# Patient Record
Sex: Female | Born: 1990 | Race: Black or African American | Hispanic: No | Marital: Single | Smoking: Never smoker
Health system: Southern US, Community
[De-identification: ages and names within clinical notes are randomized; demographics above are authoritative.]

## PROBLEM LIST (undated history)

## (undated) ENCOUNTER — Inpatient Hospital Stay (HOSPITAL_COMMUNITY): Payer: Self-pay

## (undated) DIAGNOSIS — I4891 Unspecified atrial fibrillation: Secondary | ICD-10-CM

## (undated) DIAGNOSIS — F419 Anxiety disorder, unspecified: Secondary | ICD-10-CM

## (undated) HISTORY — DX: Unspecified atrial fibrillation: I48.91

## (undated) HISTORY — DX: Anxiety disorder, unspecified: F41.9

## (undated) HISTORY — PX: OTHER SURGICAL HISTORY: SHX169

---

## 2012-10-18 ENCOUNTER — Emergency Department (HOSPITAL_COMMUNITY)
Admission: EM | Admit: 2012-10-18 | Discharge: 2012-10-19 | Disposition: A | Discharge status: Home / Self Care | Payer: BC Managed Care – PPO | Attending: Emergency Medicine | Admitting: Emergency Medicine

## 2012-10-18 ENCOUNTER — Encounter (HOSPITAL_COMMUNITY): Payer: Self-pay | Admitting: Adult Health

## 2012-10-18 DIAGNOSIS — R002 Palpitations: Secondary | ICD-10-CM | POA: Insufficient documentation

## 2012-10-18 DIAGNOSIS — R5383 Other fatigue: Secondary | ICD-10-CM | POA: Insufficient documentation

## 2012-10-18 DIAGNOSIS — R0602 Shortness of breath: Secondary | ICD-10-CM | POA: Insufficient documentation

## 2012-10-18 DIAGNOSIS — I4891 Unspecified atrial fibrillation: Secondary | ICD-10-CM | POA: Insufficient documentation

## 2012-10-18 DIAGNOSIS — F17200 Nicotine dependence, unspecified, uncomplicated: Secondary | ICD-10-CM | POA: Insufficient documentation

## 2012-10-18 DIAGNOSIS — R5381 Other malaise: Secondary | ICD-10-CM | POA: Insufficient documentation

## 2012-10-18 DIAGNOSIS — Z3202 Encounter for pregnancy test, result negative: Secondary | ICD-10-CM | POA: Insufficient documentation

## 2012-10-18 LAB — CBC WITH DIFFERENTIAL/PLATELET
Basophils Absolute: 0 10*3/uL (ref 0.0–0.1)
Basophils Relative: 0 % (ref 0–1)
Eosinophils Absolute: 0 10*3/uL (ref 0.0–0.7)
Hemoglobin: 14.7 g/dL (ref 12.0–15.0)
MCH: 26.8 pg (ref 26.0–34.0)
MCHC: 33.6 g/dL (ref 30.0–36.0)
Monocytes Relative: 3 % (ref 3–12)
Neutro Abs: 6.3 10*3/uL (ref 1.7–7.7)
Neutrophils Relative %: 79 % — ABNORMAL HIGH (ref 43–77)
Platelets: 272 10*3/uL (ref 150–400)
RBC: 5.48 MIL/uL — ABNORMAL HIGH (ref 3.87–5.11)

## 2012-10-18 LAB — POCT PREGNANCY, URINE: Preg Test, Ur: NEGATIVE

## 2012-10-18 LAB — URINALYSIS, ROUTINE W REFLEX MICROSCOPIC
Bilirubin Urine: NEGATIVE
Ketones, ur: 15 mg/dL — AB
Leukocytes, UA: NEGATIVE
Nitrite: NEGATIVE
Protein, ur: NEGATIVE mg/dL
Urobilinogen, UA: 1 mg/dL (ref 0.0–1.0)
pH: 7.5 (ref 5.0–8.0)

## 2012-10-18 LAB — BASIC METABOLIC PANEL
BUN: 14 mg/dL (ref 6–23)
Chloride: 102 mEq/L (ref 96–112)
GFR calc Af Amer: >90 mL/min (ref >90)
GFR calc non Af Amer: >90 mL/min (ref >90)
Potassium: 3.7 mEq/L (ref 3.5–5.1)
Sodium: 137 mEq/L (ref 135–145)

## 2012-10-18 LAB — TROPONIN I: Troponin I: <0.3 ng/mL (ref <0.30)

## 2012-10-18 MED ORDER — METOPROLOL SUCCINATE ER 25 MG PO TB24: 25.0000 mg | ORAL_TABLET | Freq: Two times a day (BID) | ORAL | Status: DC

## 2012-10-18 NOTE — ED Notes (Addendum)
Presents with sudden onset of chest pain associated with nausea that began at 20:30. Earlier in the day reports feeling lightheaded and seeing black spots stating and headache, "It felt like a migraine was about to come on" . Endorses Diarrhea for 3 weeks and chills.  Denies chest pain at this time. HR irregular. Pt is visibly uncomfortable. Bilateral breath sounds clear.

## 2012-10-18 NOTE — ED Provider Notes (Signed)
CSN: [id number]     Arrival date & time 10/18/12  2128 History     First MD Initiated Contact with Patient 10/18/12 2155     Chief Complaint  Patient presents with  . Chest Pain   (Consider location/radiation/quality/duration/timing/severity/associated sxs/prior Treatment) HPI Comments: Patient presents with complaints of diarrhea intermittently for the past 3 weeks.  She started today feeling dizzy, nauseated, tight in the chest, and felt her heart beating erratically.  She has never had this happen before.  No fevers or chills.  She denies excessive caffeine intake.  She does admit to smoking marijuana, but denies cocaine or other drug use.  Her LMP was several months ago as she is on the depo shot.    Patient is a 22 y.o. female presenting with chest pain. The history is provided by the patient.  Chest Pain Pain location:  Substernal area Pain quality: tightness   Pain radiates to:  Does not radiate Pain radiates to the back: no   Pain severity:  Moderate Onset quality:  Gradual Duration:  2 days Timing:  Constant Progression:  Worsening Chronicity:  New Context: not breathing   Relieved by:  Nothing Worsened by:  Nothing tried Associated symptoms: fatigue, palpitations and shortness of breath   Associated symptoms: no fever     History reviewed. No pertinent past medical history. History reviewed. No pertinent past surgical history. History reviewed. No pertinent family history. History  Substance Use Topics  . Smoking status: Current Some Day Smoker    Types: Cigarettes  . Smokeless tobacco: Not on file  . Alcohol Use: No   OB History   Grav Para Term Preterm Abortions TAB SAB Ect Mult Living                 Review of Systems  Constitutional: Positive for fatigue. Negative for fever and chills.  Respiratory: Positive for shortness of breath.   Cardiovascular: Positive for chest pain and palpitations.  All other systems reviewed and are negative.    Allergies   Review of patient's allergies indicates no known allergies.  Home Medications  No current outpatient prescriptions on file. BP 144/77  Pulse 101  Temp(Src) 98.7 F (37.1 C)  Resp 16  SpO2 100% Physical Exam  Nursing note and vitals reviewed. Constitutional: She is oriented to person, place, and time. She appears well-developed and well-nourished. No distress.  HENT:  Head: Normocephalic and atraumatic.  Neck: Normal range of motion. Neck supple.  Cardiovascular: Exam reveals no gallop and no friction rub.   No murmur heard. The heart is irregularly irregular.    Pulmonary/Chest: Effort normal and breath sounds normal. No respiratory distress. She has no wheezes.  Abdominal: Soft. Bowel sounds are normal. She exhibits no distension. There is no tenderness.  Musculoskeletal: Normal range of motion. She exhibits no edema.  Neurological: She is alert and oriented to person, place, and time.  Skin: Skin is warm and dry. She is not diaphoretic.    ED Course   Procedures (including critical care time)  Labs Reviewed  CBC WITH DIFFERENTIAL  BASIC METABOLIC PANEL  TSH  PREGNANCY, URINE  URINALYSIS, ROUTINE W REFLEX MICROSCOPIC  URINE RAPID DRUG SCREEN (HOSP PERFORMED)  TROPONIN I  POCT PREGNANCY, URINE   No results found. No diagnosis found.   Date: 10/18/2012  Rate: 99  Rhythm: atrial fibrillation  QRS Axis: normal  Intervals: normal  ST/T Wave abnormalities: normal  Conduction Disutrbances:none  Narrative Interpretation:   Old EKG Reviewed: none  available   MDM  The patient presents here with complaints of palpitations, shortness of breath.  The workup reveals atrial fibrillation with controlled ventricular response.  Her oxygen saturations are 100% and there is no evidence of chf.  She is not tachypneic and otherwise appears well.  The workup reveals negative troponin, electrolytes, and blood counts.  I have spoken with the cardiology fellow who recommends a beta  blocker and followup with cardiology.  She was re-examined and appears well.  Will prescribe metoprolol and give the followup information for [facility].    Geoffery Lyons, MD 10/18/12 [phone]

## 2012-10-19 LAB — PREGNANCY, URINE: Preg Test, Ur: NEGATIVE

## 2012-10-19 LAB — RAPID URINE DRUG SCREEN, HOSP PERFORMED
Barbiturates: NOT DETECTED
Benzodiazepines: NOT DETECTED

## 2012-10-19 LAB — TSH: TSH: 0.596 u[IU]/mL (ref 0.350–4.500)

## 2012-10-20 ENCOUNTER — Emergency Department (HOSPITAL_COMMUNITY): Payer: BC Managed Care – PPO

## 2012-10-20 ENCOUNTER — Encounter (HOSPITAL_COMMUNITY): Payer: Self-pay | Admitting: Emergency Medicine

## 2012-10-20 ENCOUNTER — Emergency Department (HOSPITAL_COMMUNITY)
Admission: EM | Admit: 2012-10-20 | Discharge: 2012-10-20 | Disposition: A | Discharge status: Home / Self Care | Payer: BC Managed Care – PPO | Attending: Emergency Medicine | Admitting: Emergency Medicine

## 2012-10-20 DIAGNOSIS — F17200 Nicotine dependence, unspecified, uncomplicated: Secondary | ICD-10-CM | POA: Insufficient documentation

## 2012-10-20 DIAGNOSIS — I4891 Unspecified atrial fibrillation: Secondary | ICD-10-CM

## 2012-10-20 DIAGNOSIS — F411 Generalized anxiety disorder: Secondary | ICD-10-CM | POA: Insufficient documentation

## 2012-10-20 DIAGNOSIS — R064 Hyperventilation: Secondary | ICD-10-CM | POA: Insufficient documentation

## 2012-10-20 DIAGNOSIS — R079 Chest pain, unspecified: Secondary | ICD-10-CM | POA: Insufficient documentation

## 2012-10-20 DIAGNOSIS — F419 Anxiety disorder, unspecified: Secondary | ICD-10-CM

## 2012-10-20 MED ORDER — METOPROLOL SUCCINATE 12.5 MG HALF TABLET: 12.5000 mg | ORAL_TABLET | Freq: Two times a day (BID) | ORAL | Status: DC

## 2012-10-20 NOTE — ED Notes (Addendum)
PT was seen here yesterday and was put on metoprolol. Mother has hx of afib and apparently was dx with afib yesterday as well. Pt also has hx of anxiety. Was at work and started feeling anxious and was unable to calm down. When EMS got there, they were able to calm her down. 12 lead unremarkable. Heart rate was in 70's.

## 2012-10-20 NOTE — ED Provider Notes (Signed)
CSN: [id number]     Arrival date & time 10/20/12  1237 History     First MD Initiated Contact with Patient 10/20/12 1244     Chief Complaint  Patient presents with  . Panic Attack   (Consider location/radiation/quality/duration/timing/severity/associated sxs/prior Treatment) The history is provided by the patient.  pt c/o feeling very anxious while at work today. States she felt a transient sharp pain in mid chest, lasted a couple seconds. 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 Was hyperventilating. ems called, pt able to be calmed down. Pt denies palpitations or sense of rapid or irregular heart beat. No sob. No nv. Pt now states is feeling improved. No cp.  Pt was seen in ed a couple days ago. Was found to be in afib, was given rx metoprolol w cardiology follow up this week.  Pt indicates otherwise feels well, at baseline. No syncopal episodes. Today no sense of rapid or irregular heartbeat.     No past medical history on file. No past surgical history on file. No family history on file. History  Substance Use Topics  . Smoking status: Current Some Day Smoker    Types: Cigarettes  . Smokeless tobacco: Not on file  . Alcohol Use: No   OB History   Grav Para Term Preterm Abortions TAB SAB Ect Mult Living                 Review of Systems  Constitutional: Negative for fever and chills.  HENT: Negative for neck pain.   Eyes: Negative for visual disturbance.  Respiratory: Negative for shortness of breath.   Cardiovascular: Negative for chest pain, palpitations and leg swelling.  Gastrointestinal: Negative for abdominal pain.  Genitourinary: Negative for flank pain.  Musculoskeletal: Negative for back pain.  Skin: Negative for rash.  Neurological: Negative for headaches.  Hematological: Does not bruise/bleed easily.  Psychiatric/Behavioral: The patient is nervous/anxious.     Allergies  Review of patient's allergies indicates no known  allergies.  Home Medications   Current Outpatient Rx  Name  Route  Sig  Dispense  Refill  . ibuprofen (ADVIL,MOTRIN) 600 MG tablet   Oral   Take 600 mg by mouth every 6 (six) hours as needed for pain.         . MedroxyPROGESTERone Acetate (DEPO-PROVERA IM)   Intramuscular   Inject 1 each into the muscle every 3 (three) months.         . metoprolol succinate (TOPROL XL) 25 MG 24 hr tablet   Oral   Take 1 tablet (25 mg total) by mouth 2 (two) times daily.   20 tablet   0    BP 126/76  Pulse 72  Temp(Src) 99 F (37.2 C) (Oral)  Resp 18  Ht 5\' 7"  (1.702 m)  Wt 155 lb (70.308 kg)  BMI 24.27 kg/m2  SpO2 100% Physical Exam  Nursing note and vitals reviewed. Constitutional: She is oriented to person, place, and time. She appears well-developed and well-nourished. No distress.  HENT:  Mouth/Throat: Oropharynx is clear and moist.  Eyes: Conjunctivae are normal. No scleral icterus.  Neck: Neck supple. No tracheal deviation present. No thyromegaly present.  Cardiovascular: Normal rate, regular rhythm, normal heart sounds and intact distal pulses.  Exam reveals no gallop and no friction rub.   No murmur heard. Pulmonary/Chest: Effort normal and breath sounds normal. No respiratory distress.  Abdominal: Normal appearance. She exhibits no distension. There is no tenderness.  Musculoskeletal:  She exhibits no edema and no tenderness.  Neurological: She is alert and oriented to person, place, and time.  Skin: Skin is warm and dry. No rash noted. She is not diaphoretic.  Psychiatric: She has a normal mood and affect.    ED Course   Procedures (including critical care time)  Results for orders placed during the hospital encounter of 10/18/12  CBC WITH DIFFERENTIAL      Result Value Range   WBC 7.9  4.0 - 10.5 K/uL   RBC 5.48 (*) 3.87 - 5.11 MIL/uL   Hemoglobin 14.7  12.0 - 15.0 g/dL   HCT 46.9  62.9 - 52.8 %   MCV 79.9  78.0 - 100.0 fL   MCH 26.8  26.0 - 34.0 pg   MCHC  33.6  30.0 - 36.0 g/dL   RDW 41.3  24.4 - 01.0 %   Platelets 272  150 - 400 K/uL   Neutrophils Relative % 79 (*) 43 - 77 %   Neutro Abs 6.3  1.7 - 7.7 K/uL   Lymphocytes Relative 18  12 - 46 %   Lymphs Abs 1.4  0.7 - 4.0 K/uL   Monocytes Relative 3  3 - 12 %   Monocytes Absolute 0.2  0.1 - 1.0 K/uL   Eosinophils Relative 0  0 - 5 %   Eosinophils Absolute 0.0  0.0 - 0.7 K/uL   Basophils Relative 0  0 - 1 %   Basophils Absolute 0.0  0.0 - 0.1 K/uL  BASIC METABOLIC PANEL      Result Value Range   Sodium 137  135 - 145 mEq/L   Potassium 3.7  3.5 - 5.1 mEq/L   Chloride 102  96 - 112 mEq/L   CO2 21  19 - 32 mEq/L   Glucose, Bld 102 (*) 70 - 99 mg/dL   BUN 14  6 - 23 mg/dL   Creatinine, Ser 2.72  0.50 - 1.10 mg/dL   Calcium 9.8  8.4 - 53.6 mg/dL   GFR calc non Af Amer >90  >90 mL/min   GFR calc Af Amer >90  >90 mL/min  TSH      Result Value Range   TSH 0.596  0.350 - 4.500 uIU/mL  PREGNANCY, URINE      Result Value Range   Preg Test, Ur NEGATIVE  NEGATIVE  URINALYSIS, ROUTINE W REFLEX MICROSCOPIC      Result Value Range   Color, Urine YELLOW  YELLOW   APPearance CLEAR  CLEAR   Specific Gravity, Urine 1.018  1.005 - 1.030   pH 7.5  5.0 - 8.0   Glucose, UA NEGATIVE  NEGATIVE mg/dL   Hgb urine dipstick NEGATIVE  NEGATIVE   Bilirubin Urine NEGATIVE  NEGATIVE   Ketones, ur 15 (*) NEGATIVE mg/dL   Protein, ur NEGATIVE  NEGATIVE mg/dL   Urobilinogen, UA 1.0  0.0 - 1.0 mg/dL   Nitrite NEGATIVE  NEGATIVE   Leukocytes, UA NEGATIVE  NEGATIVE  URINE RAPID DRUG SCREEN (HOSP PERFORMED)      Result Value Range   Opiates NONE DETECTED  NONE DETECTED   Cocaine NONE DETECTED  NONE DETECTED   Benzodiazepines NONE DETECTED  NONE DETECTED   Amphetamines NONE DETECTED  NONE DETECTED   Tetrahydrocannabinol NONE DETECTED  NONE DETECTED   Barbiturates NONE DETECTED  NONE DETECTED  TROPONIN I      Result Value Range   Troponin I <0.30  <0.30 ng/mL  POCT PREGNANCY, URINE  Result Value  Range   Preg Test, Ur NEGATIVE  NEGATIVE   Dg Chest 2 View  10/20/2012   *RADIOLOGY REPORT*  Clinical Data: Chest pressure, AFib  CHEST - 2 VIEW  Comparison: None.  Findings: Lungs are clear. No pleural effusion or pneumothorax.  Cardiomediastinal silhouette is within normal limits.  Visualized osseous structures are within normal limits.  IMPRESSION: No evidence of acute cardiopulmonary disease.   Original Report Authenticated By: Charline Bills, M.D.      MDM  Monitor.   Date: 10/20/2012  Rate: 61  Rhythm: normal sinus rhythm  QRS Axis: normal  Intervals: normal  ST/T Wave abnormalities: normal  Conduction Disutrbances:none  Narrative Interpretation:   Old EKG Reviewed: changes noted  Reviewed nursing notes and prior charts for additional history.   Reviewed recent lab results from 8/1.   Pt states overall feels better since rx metoprolol, but states occasionally notes mild nausea, lightheaded feeling when stands quickly.  Pt remains in nsr.  Will decreased metoprolol dose to 12/5 mg bid to minimize potentially side effects.  Recheck pt, remains asymptomatic. Nsr. Pt stable for d/c.        Suzi Roots, MD 10/20/12 [phone]

## 2012-10-20 NOTE — ED Notes (Signed)
MD at bedside. 

## 2012-10-20 NOTE — ED Notes (Signed)
Patient transported to X-ray 

## 2012-10-21 ENCOUNTER — Encounter: Payer: Self-pay | Admitting: *Deleted

## 2012-10-21 ENCOUNTER — Encounter: Payer: Self-pay | Admitting: Cardiology

## 2012-10-21 DIAGNOSIS — I4891 Unspecified atrial fibrillation: Secondary | ICD-10-CM | POA: Insufficient documentation

## 2012-10-21 DIAGNOSIS — F419 Anxiety disorder, unspecified: Secondary | ICD-10-CM | POA: Insufficient documentation

## 2012-10-23 ENCOUNTER — Ambulatory Visit (INDEPENDENT_AMBULATORY_CARE_PROVIDER_SITE_OTHER): Payer: BC Managed Care – PPO | Admitting: Cardiology

## 2012-10-23 ENCOUNTER — Encounter: Payer: Self-pay | Admitting: Cardiology

## 2012-10-23 VITALS — BP 118/70 | HR 70 | Ht 67.0 in | Wt 150.0 lb

## 2012-10-23 DIAGNOSIS — R079 Chest pain, unspecified: Secondary | ICD-10-CM

## 2012-10-23 DIAGNOSIS — I4891 Unspecified atrial fibrillation: Secondary | ICD-10-CM

## 2012-10-23 DIAGNOSIS — R002 Palpitations: Secondary | ICD-10-CM | POA: Insufficient documentation

## 2012-10-23 MED ORDER — METOPROLOL SUCCINATE 12.5 MG HALF TABLET: 12.5000 mg | ORAL_TABLET | Freq: Two times a day (BID) | ORAL | Status: DC

## 2012-10-23 NOTE — Progress Notes (Signed)
  HPI: 22 year old female for evaluation of atrial fibrillation. Chest x-ray on 10/20/2012 showed no acute cardiopulmonary disease. Laboratories in August of 2014 showed a TSH of 0.596, negative drug screen, negative pregnancy test, hemoglobin 14.7, BUN and creatinine 14/0.84. Patient seen in the emergency room on August 1 with complaints of diarrhea. She also described dizziness and chest tightness as well as palpitations. Patient noted to be in atrial fibrillation. Patient given a prescription for metoprolol and scheduled to followup here. Seen again on August 3 with a transient sharp pain in the mid chest. Followup electrocardiogram showed sinus rhythm. Patient states that on the day of evaluation she developed symptoms similar to a migraine. She had nausea and vomiting again noted chest tightness and palpitations. She has continued to have chest tightness almost continuously since then. She has some dyspnea on exertion but there is no orthopnea, PND, pedal edema or syncope.  Current Outpatient Prescriptions  Medication Sig Dispense Refill  . ibuprofen (ADVIL,MOTRIN) 600 MG tablet Take 600 mg by mouth every 6 (six) hours as needed for pain.      . MedroxyPROGESTERone Acetate (DEPO-PROVERA IM) Inject 1 each into the muscle every 3 (three) months.      . metoprolol succinate (TOPROL XL) 12.5 mg TB24 Take 0.5 tablets (12.5 mg total) by mouth 2 (two) times daily.  20 tablet  0   No current facility-administered medications for this visit.    No Known Allergies  Past Medical History  Diagnosis Date  . Anxiety   . A-fib     History reviewed. No pertinent past surgical history.  History   Social History  . Marital Status: Single    Spouse Name: N/A    Number of Children: N/A  . Years of Education: N/A   Occupational History  . Not on file.   Social History Main Topics  . Smoking status: Never Smoker   . Smokeless tobacco: Not on file  . Alcohol Use: Yes     Comment: Occasional  .  Drug Use: Yes    Special: Marijuana  . Sexually Active: Not on file   Other Topics Concern  . Not on file   Social History Narrative  . No narrative on file    Family History  Problem Relation Age of Onset  . Heart disease Mother     Atrial fibrillation    ROS: no fevers or chills, productive cough, hemoptysis, dysphasia, odynophagia, melena, hematochezia, dysuria, hematuria, rash, seizure activity, orthopnea, PND, pedal edema, claudication. Remaining systems are negative.  Physical Exam:   Blood pressure 118/70, pulse 70, height 5\' 7"  (1.702 m), weight 150 lb (68.04 kg).  General:  Well developed/well nourished in NAD Skin warm/dry, tattoos Patient not depressed No peripheral clubbing Back-normal HEENT-normal/normal eyelids Neck supple/normal carotid upstroke bilaterally; no bruits; no JVD; no thyromegaly chest - CTA/ normal expansion CV - RRR/normal S1 and S2; no murmurs, rubs or gallops;  PMI nondisplaced Abdomen -NT/ND, no HSM, no mass, + bowel sounds, no bruit 2+ femoral pulses, no bruits Ext-no edema, chords, 2+ DP Neuro-grossly nonfocal  ECG 10/18/2012, atrial fibrillation, low voltage. Nonspecific ST changes. 10/20/2012-sinus rhythm, low voltage, poor R wave progression, nonspecific ST changes. Sinus rhythm at a rate of 73 with no ST changes.

## 2012-10-23 NOTE — Patient Instructions (Addendum)
Your physician recommends that you schedule a follow-up appointment in: 3 MONTHS WITH DR CRENSHAW   Your physician has requested that you have an echocardiogram. Echocardiography is a painless test that uses sound waves to create images of your heart. It provides your doctor with information about the size and shape of your heart and how well your heart's chambers and valves are working. This procedure takes approximately one hour. There are no restrictions for this procedure.   Your physician recommends that you HAVE LAB WORK TODAY 

## 2012-10-23 NOTE — Assessment & Plan Note (Signed)
Patient with paroxysmal atrial fibrillation. She is in sinus rhythm today. She has no embolic risk factors. We'll continue metoprolol for rate control if her atrial fibrillation recurs. Check echocardiogram for LV function. Recent TSH normal.

## 2012-10-23 NOTE — Assessment & Plan Note (Signed)
Patient also with h/o palpitations; if she has frequent episodes in the future, will consider monitor (not clear all symptoms are related to atrial fibrillation).

## 2012-10-23 NOTE — Assessment & Plan Note (Signed)
Etiology unclear.electrocardiogram shows no ST changes. No right upper quadrant tenderness. Will arrange echocardiogram to assess LV function. Note she has some dyspnea on exertion and question pleuritic component to her pain. Will schedule a d-dimer to screen for pulmonary embolus although I think this is unlikely.

## 2012-10-24 LAB — D-DIMER, QUANTITATIVE: D-Dimer, Quant: 0.74 ug/mL-FEU — ABNORMAL HIGH (ref 0.00–0.48)

## 2012-10-25 ENCOUNTER — Telehealth: Payer: Self-pay | Admitting: Cardiology

## 2012-10-25 ENCOUNTER — Other Ambulatory Visit: Payer: Self-pay | Admitting: *Deleted

## 2012-10-25 DIAGNOSIS — R7989 Other specified abnormal findings of blood chemistry: Secondary | ICD-10-CM

## 2012-10-25 NOTE — Telephone Encounter (Signed)
Spoke with pt, aware of elevated d-dimer. She will have a CTA to r/o PE Tuesday 10-29-12 @ 3:30 PM prior to having her echo. Left message for pt of time and location of CTA.

## 2012-10-25 NOTE — Telephone Encounter (Signed)
New prob  Pt returned call about results.

## 2012-10-29 ENCOUNTER — Ambulatory Visit (HOSPITAL_COMMUNITY): Payer: BC Managed Care – PPO | Attending: Cardiology | Admitting: Radiology

## 2012-10-29 ENCOUNTER — Ambulatory Visit (INDEPENDENT_AMBULATORY_CARE_PROVIDER_SITE_OTHER)
Admission: RE | Admit: 2012-10-29 | Discharge: 2012-10-29 | Disposition: A | Discharge status: Home / Self Care | Payer: BC Managed Care – PPO | Source: Ambulatory Visit | Attending: Cardiology | Admitting: Cardiology

## 2012-10-29 DIAGNOSIS — I4891 Unspecified atrial fibrillation: Secondary | ICD-10-CM

## 2012-10-29 DIAGNOSIS — R002 Palpitations: Secondary | ICD-10-CM | POA: Insufficient documentation

## 2012-10-29 DIAGNOSIS — R7989 Other specified abnormal findings of blood chemistry: Secondary | ICD-10-CM

## 2012-10-29 DIAGNOSIS — R791 Abnormal coagulation profile: Secondary | ICD-10-CM

## 2012-10-29 DIAGNOSIS — R079 Chest pain, unspecified: Secondary | ICD-10-CM | POA: Insufficient documentation

## 2012-10-29 DIAGNOSIS — R072 Precordial pain: Secondary | ICD-10-CM

## 2012-10-29 MED ORDER — IOHEXOL 350 MG/ML SOLN
80.0000 mL | Freq: Once | INTRAVENOUS | Status: AC | PRN
  Administered 2012-10-29: 80 mL via INTRAVENOUS

## 2012-10-29 NOTE — Progress Notes (Signed)
Echocardiogram performed.  

## 2012-10-30 ENCOUNTER — Telehealth: Payer: Self-pay | Admitting: Cardiology

## 2012-10-30 NOTE — Telephone Encounter (Signed)
Spoke with pt, aware of echo and CT results. 

## 2012-10-30 NOTE — Telephone Encounter (Signed)
New problem    Patient calling stating someone called her this am. Returning call back

## 2012-10-30 NOTE — Telephone Encounter (Signed)
Left message for pt to call.

## 2012-10-31 ENCOUNTER — Ambulatory Visit: Payer: BC Managed Care – PPO | Admitting: Cardiovascular Disease

## 2012-11-07 ENCOUNTER — Telehealth: Payer: Self-pay

## 2012-11-07 NOTE — Telephone Encounter (Signed)
I spoke with Clydie Braun and made her aware of Rx per Dr Jens Som.  ----- Message ----- From: Lewayne Bunting, MD Sent: 11/07/2012 2:39 PM To: Deliah Goody, RN Subject: FW: clarify Toprol Rx Toprol 12.5 mg po BID Olga Millers ----- Message ----- From: Sharyn Blitz, RN Sent: 11/07/2012 2:01 PM To: Lewayne Bunting, MD Subject: clarify Toprol Rx Pharmacy called because Rx was written for Toprol XL 12.5 mg tablet take 1/2 tablet by mouth twice a day--pharmacy need to verify if this is correct or should it have been 25mg  tablet take 1/2 tab by mouth twice a day Thanks

## 2013-01-29 ENCOUNTER — Ambulatory Visit: Payer: BC Managed Care – PPO | Admitting: Cardiology

## 2013-02-21 ENCOUNTER — Encounter: Payer: Self-pay | Admitting: Cardiology

## 2013-05-08 ENCOUNTER — Encounter: Payer: Self-pay | Admitting: Cardiology

## 2013-06-24 ENCOUNTER — Encounter (HOSPITAL_COMMUNITY): Payer: Self-pay | Admitting: Emergency Medicine

## 2013-06-24 ENCOUNTER — Emergency Department (HOSPITAL_COMMUNITY)
Admission: EM | Admit: 2013-06-24 | Discharge: 2013-06-24 | Disposition: A | Discharge status: Home / Self Care | Payer: BC Managed Care – PPO | Attending: Emergency Medicine | Admitting: Emergency Medicine

## 2013-06-24 DIAGNOSIS — R05 Cough: Secondary | ICD-10-CM | POA: Insufficient documentation

## 2013-06-24 DIAGNOSIS — R059 Cough, unspecified: Secondary | ICD-10-CM | POA: Insufficient documentation

## 2013-06-24 DIAGNOSIS — Z3202 Encounter for pregnancy test, result negative: Secondary | ICD-10-CM | POA: Insufficient documentation

## 2013-06-24 DIAGNOSIS — F411 Generalized anxiety disorder: Secondary | ICD-10-CM | POA: Insufficient documentation

## 2013-06-24 DIAGNOSIS — F410 Panic disorder [episodic paroxysmal anxiety] without agoraphobia: Secondary | ICD-10-CM

## 2013-06-24 DIAGNOSIS — R0602 Shortness of breath: Secondary | ICD-10-CM | POA: Insufficient documentation

## 2013-06-24 LAB — POC URINE PREG, ED
PREG TEST UR: NEGATIVE
Preg Test, Ur: NEGATIVE

## 2013-06-24 LAB — PRO B NATRIURETIC PEPTIDE: Pro B Natriuretic peptide (BNP): 10.3 pg/mL (ref 0–125)

## 2013-06-24 LAB — BASIC METABOLIC PANEL
BUN: 16 mg/dL (ref 6–23)
CALCIUM: 9.3 mg/dL (ref 8.4–10.5)
CO2: 24 meq/L (ref 19–32)
CREATININE: 0.98 mg/dL (ref 0.50–1.10)
Chloride: 102 mEq/L (ref 96–112)
GFR calc Af Amer: >90 mL/min (ref >90)
GFR calc non Af Amer: 82 mL/min — ABNORMAL LOW (ref >90)
Glucose, Bld: 119 mg/dL — ABNORMAL HIGH (ref 70–99)
Potassium: 3.4 mEq/L — ABNORMAL LOW (ref 3.7–5.3)
Sodium: 142 mEq/L (ref 137–147)

## 2013-06-24 LAB — CBC
HEMATOCRIT: 41.1 % (ref 36.0–46.0)
Hemoglobin: 14.1 g/dL (ref 12.0–15.0)
MCH: 27.9 pg (ref 26.0–34.0)
MCHC: 34.3 g/dL (ref 30.0–36.0)
MCV: 81.2 fL (ref 78.0–100.0)
PLATELETS: 251 10*3/uL (ref 150–400)
RBC: 5.06 MIL/uL (ref 3.87–5.11)
RDW: 12.2 % (ref 11.5–15.5)
WBC: 6.2 10*3/uL (ref 4.0–10.5)

## 2013-06-24 LAB — I-STAT TROPONIN, ED: Troponin i, poc: 0 ng/mL (ref 0.00–0.08)

## 2013-06-24 MED ORDER — LORAZEPAM 1 MG PO TABS: 1.0000 mg | ORAL_TABLET | Freq: Three times a day (TID) | ORAL | Status: DC | PRN

## 2013-06-24 MED ORDER — LORAZEPAM 1 MG PO TABS
1.0000 mg | ORAL_TABLET | Freq: Once | ORAL | Status: AC
  Administered 2013-06-24: 1 mg via ORAL
  Filled 2013-06-24: qty 1

## 2013-06-24 MED ORDER — IBUPROFEN 400 MG PO TABS: 400.0000 mg | ORAL_TABLET | Freq: Four times a day (QID) | ORAL | Status: DC | PRN

## 2013-06-24 MED ORDER — IBUPROFEN 400 MG PO TABS
600.0000 mg | ORAL_TABLET | Freq: Once | ORAL | Status: AC
  Administered 2013-06-24: 600 mg via ORAL
  Filled 2013-06-24 (×2): qty 1

## 2013-06-24 NOTE — ED Notes (Signed)
Dr. Nanavati at bedside 

## 2013-06-24 NOTE — ED Notes (Signed)
Pt. reports left chest pain with SOB onset this evening , denies nausea or diaphoresis .  

## 2013-06-24 NOTE — Discharge Instructions (Signed)
Panic Attacks  Panic attacks are sudden, short-lived surges of severe anxiety, fear, or discomfort. They may occur for no reason when you are relaxed, when you are anxious, or when you are sleeping. Panic attacks may occur for a number of reasons:   · Healthy people occasionally have panic attacks in extreme, life-threatening situations, such as war or natural disasters. Normal anxiety is a protective mechanism of the body that helps us react to danger (fight or flight response).  · Panic attacks are often seen with anxiety disorders, such as panic disorder, social anxiety disorder, generalized anxiety disorder, and phobias. Anxiety disorders cause excessive or uncontrollable anxiety. They may interfere with your relationships or other life activities.  · Panic attacks are sometimes seen with other mental illnesses such as depression and posttraumatic stress disorder.  · Certain medical conditions, prescription medicines, and drugs of abuse can cause panic attacks.  SYMPTOMS   Panic attacks start suddenly, peak within 20 minutes, and are accompanied by four or more of the following symptoms:  · Pounding heart or fast heart rate (palpitations).  · Sweating.  · Trembling or shaking.  · Shortness of breath or feeling smothered.  · Feeling choked.  · Chest pain or discomfort.  · Nausea or strange feeling in your stomach.  · Dizziness, lightheadedness, or feeling like you will faint.  · Chills or hot flushes.  · Numbness or tingling in your lips or hands and feet.  · Feeling that things are not real or feeling that you are not yourself.  · Fear of losing control or going crazy.  · Fear of dying.  Some of these symptoms can mimic serious medical conditions. For example, you may think you are having a heart attack. Although panic attacks can be very scary, they are not life threatening.  DIAGNOSIS   Panic attacks are diagnosed through an assessment by your health care provider. Your health care provider will ask questions  about your symptoms, such as where and when they occurred. Your health care provider will also ask about your medical history and use of alcohol and drugs, including prescription medicines. Your health care provider may order blood tests or other studies to rule out a serious medical condition. Your health care provider may refer you to a mental health professional for further evaluation.  TREATMENT   · Most healthy people who have one or two panic attacks in an extreme, life-threatening situation will not require treatment.  · The treatment for panic attacks associated with anxiety disorders or other mental illness typically involves counseling with a mental health professional, medicine, or a combination of both. Your health care provider will help determine what treatment is best for you.  · Panic attacks due to physical illness usually goes away with treatment of the illness. If prescription medicine is causing panic attacks, talk with your health care provider about stopping the medicine, decreasing the dose, or substituting another medicine.  · Panic attacks due to alcohol or drug abuse goes away with abstinence. Some adults need professional help in order to stop drinking or using drugs.  HOME CARE INSTRUCTIONS   · Take all your medicines as prescribed.    · Check with your health care provider before starting new prescription or over-the-counter medicines.  · Keep all follow up appointments with your health care provider.  SEEK MEDICAL CARE IF:  · You are not able to take your medicines as prescribed.  · Your symptoms do not improve or get worse.  SEEK IMMEDIATE   MEDICAL CARE IF:   · You experience panic attack symptoms that are different than your usual symptoms.  · You have serious thoughts about hurting yourself or others.  · You are taking medicine for panic attacks and have a serious side effect.  MAKE SURE YOU:  · Understand these instructions.  · Will watch your condition.  · Will get help right away  if you are not doing well or get worse.  Document Released: 03/06/2005 Document Revised: 12/25/2012 Document Reviewed: 10/18/2012  [employer]® Patient Information ©2014 [employer].
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
## 2014-01-28 ENCOUNTER — Other Ambulatory Visit: Payer: Self-pay

## 2014-01-28 ENCOUNTER — Encounter (HOSPITAL_COMMUNITY): Payer: Self-pay | Admitting: Emergency Medicine

## 2014-01-28 ENCOUNTER — Emergency Department (HOSPITAL_COMMUNITY): Payer: BC Managed Care – PPO

## 2014-01-28 ENCOUNTER — Emergency Department (HOSPITAL_COMMUNITY)
Admission: EM | Admit: 2014-01-28 | Discharge: 2014-01-28 | Disposition: A | Discharge status: Nursing Home (Includes ICF) | Payer: BC Managed Care – PPO | Source: Home / Self Care | Attending: Emergency Medicine | Admitting: Emergency Medicine

## 2014-01-28 ENCOUNTER — Emergency Department (HOSPITAL_COMMUNITY)
Admission: EM | Admit: 2014-01-28 | Discharge: 2014-01-28 | Disposition: A | Discharge status: Home / Self Care | Payer: BC Managed Care – PPO | Attending: Emergency Medicine | Admitting: Emergency Medicine

## 2014-01-28 DIAGNOSIS — I4891 Unspecified atrial fibrillation: Secondary | ICD-10-CM

## 2014-01-28 DIAGNOSIS — R112 Nausea with vomiting, unspecified: Secondary | ICD-10-CM

## 2014-01-28 DIAGNOSIS — R079 Chest pain, unspecified: Secondary | ICD-10-CM | POA: Insufficient documentation

## 2014-01-28 DIAGNOSIS — R0789 Other chest pain: Secondary | ICD-10-CM

## 2014-01-28 DIAGNOSIS — Z8659 Personal history of other mental and behavioral disorders: Secondary | ICD-10-CM | POA: Insufficient documentation

## 2014-01-28 DIAGNOSIS — Z3202 Encounter for pregnancy test, result negative: Secondary | ICD-10-CM | POA: Insufficient documentation

## 2014-01-28 DIAGNOSIS — R002 Palpitations: Secondary | ICD-10-CM

## 2014-01-28 DIAGNOSIS — R197 Diarrhea, unspecified: Secondary | ICD-10-CM | POA: Diagnosis not present

## 2014-01-28 DIAGNOSIS — R0602 Shortness of breath: Secondary | ICD-10-CM

## 2014-01-28 LAB — COMPREHENSIVE METABOLIC PANEL
ALBUMIN: 3.9 g/dL (ref 3.5–5.2)
ALK PHOS: 59 U/L (ref 39–117)
ALT: 10 U/L (ref 0–35)
ANION GAP: 12 (ref 5–15)
AST: 17 U/L (ref 0–37)
BUN: 13 mg/dL (ref 6–23)
CHLORIDE: 104 meq/L (ref 96–112)
CO2: 24 mEq/L (ref 19–32)
Calcium: 9.1 mg/dL (ref 8.4–10.5)
Creatinine, Ser: 0.76 mg/dL (ref 0.50–1.10)
GFR calc Af Amer: >90 mL/min (ref >90)
GFR calc non Af Amer: >90 mL/min (ref >90)
Glucose, Bld: 95 mg/dL (ref 70–99)
POTASSIUM: 3.9 meq/L (ref 3.7–5.3)
Sodium: 140 mEq/L (ref 137–147)
Total Bilirubin: 0.4 mg/dL (ref 0.3–1.2)
Total Protein: 7 g/dL (ref 6.0–8.3)

## 2014-01-28 LAB — CBC WITH DIFFERENTIAL/PLATELET
Basophils Absolute: 0 10*3/uL (ref 0.0–0.1)
Basophils Relative: 0 % (ref 0–1)
Eosinophils Absolute: 0 10*3/uL (ref 0.0–0.7)
Eosinophils Relative: 0 % (ref 0–5)
HEMATOCRIT: 39.9 % (ref 36.0–46.0)
HEMOGLOBIN: 13.2 g/dL (ref 12.0–15.0)
LYMPHS ABS: 1.5 10*3/uL (ref 0.7–4.0)
LYMPHS PCT: 19 % (ref 12–46)
MCH: 27.4 pg (ref 26.0–34.0)
MCHC: 33.1 g/dL (ref 30.0–36.0)
MCV: 83 fL (ref 78.0–100.0)
MONO ABS: 0.3 10*3/uL (ref 0.1–1.0)
MONOS PCT: 3 % (ref 3–12)
NEUTROS ABS: 6.2 10*3/uL (ref 1.7–7.7)
Neutrophils Relative %: 78 % — ABNORMAL HIGH (ref 43–77)
Platelets: 269 10*3/uL (ref 150–400)
RBC: 4.81 MIL/uL (ref 3.87–5.11)
RDW: 12.4 % (ref 11.5–15.5)
WBC: 8 10*3/uL (ref 4.0–10.5)

## 2014-01-28 LAB — POC URINE PREG, ED: PREG TEST UR: NEGATIVE

## 2014-01-28 LAB — D-DIMER, QUANTITATIVE (NOT AT ARMC): D DIMER QUANT: 0.73 ug{FEU}/mL — AB (ref 0.00–0.48)

## 2014-01-28 LAB — I-STAT TROPONIN, ED: Troponin i, poc: 0.01 ng/mL (ref 0.00–0.08)

## 2014-01-28 LAB — LIPASE, BLOOD: Lipase: 25 U/L (ref 11–59)

## 2014-01-28 MED ORDER — IOHEXOL 350 MG/ML SOLN
100.0000 mL | Freq: Once | INTRAVENOUS | Status: AC | PRN
  Administered 2014-01-28: 100 mL via INTRAVENOUS

## 2014-01-28 MED ORDER — ONDANSETRON 4 MG PO TBDP
4.0000 mg | ORAL_TABLET | Freq: Once | ORAL | Status: AC
  Administered 2014-01-28: 4 mg via ORAL

## 2014-01-28 MED ORDER — ONDANSETRON 4 MG PO TBDP
ORAL_TABLET | ORAL | Status: AC
  Filled 2014-01-28: qty 1

## 2014-01-28 MED ORDER — SODIUM CHLORIDE 0.9 % IV SOLN
INTRAVENOUS | Status: DC
  Administered 2014-01-28: 17:00:00 via INTRAVENOUS
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
## 2014-01-28 NOTE — ED Notes (Signed)
Pt has hx of a fib diagnosed 1 year ago; pt began having chest pain last Thursday intermittently to the left chest. Pt states she was short of breath more on exertion but the chest pain was worse while resting. Pt began having nausea, vomiting, and diarrhea this afternoon. Pt was given 4 of zofran in route. No chest pain at this time. Sinus rhythm at this time. Pt hasn't seen cardiologist since last year.

## 2014-01-28 NOTE — Discharge Instructions (Signed)

## 2014-01-28 NOTE — ED Provider Notes (Signed)
CSN: [id number]     Arrival date & time 01/28/14  1726 History   First MD Initiated Contact with Patient 01/28/14 1735     Chief Complaint  Patient presents with  . Atrial Fibrillation  . Chest Pain     (Consider location/radiation/quality/duration/timing/severity/associated sxs/prior Treatment) HPI Comments: Patient is a 23 year old female who presents the emergency department for evaluation of chest pain, palpitations, lightheadedness, nausea, vomiting, diarrhea. She reports that her symptoms initially began on Thursday. At that time she was having a sharp chest pain and associated shortness of breath. Today she began to develop a gradually worsening migraine. She took ibuprofen which improved her symptoms. She has been seen by cardiology in the past and given metoprolol which she stopped taking because she did not like the way it made her feel.   Patient is a 23 y.o. female presenting with atrial fibrillation and chest pain. The history is provided by the patient. No language interpreter was used.  Atrial Fibrillation Associated symptoms include chest pain, nausea and vomiting. Pertinent negatives include no abdominal pain, chills or fever.  Chest Pain Associated symptoms: nausea, palpitations, shortness of breath and vomiting   Associated symptoms: no abdominal pain and no fever     Past Medical History  Diagnosis Date  . Anxiety   . A-fib    History reviewed. No pertinent past surgical history. Family History  Problem Relation Age of Onset  . Heart disease Mother     Atrial fibrillation   History  Substance Use Topics  . Smoking status: Never Smoker   . Smokeless tobacco: Not on file  . Alcohol Use: Yes     Comment: Occasional   OB History    No data available     Review of Systems  Constitutional: Negative for fever and chills.  Respiratory: Positive for shortness of breath.   Cardiovascular: Positive for chest pain and palpitations. Negative for leg swelling.   Gastrointestinal: Positive for nausea, vomiting and diarrhea. Negative for abdominal pain.  All other systems reviewed and are negative.     Allergies  Review of patient's allergies indicates no known allergies.  Home Medications   Prior to Admission medications   Medication Sig Start Date End Date Taking? Authorizing Provider  ibuprofen (ADVIL,MOTRIN) 400 MG tablet Take 1 tablet (400 mg total) by mouth every 6 (six) hours as needed. 06/24/13  Yes Ankit Nanavati, MD   BP 110/64 mmHg  Pulse 73  Temp(Src) 98.4 F (36.9 C) (Oral)  Resp 15  Ht 5\' 5"  (1.651 m)  Wt 155 lb (70.308 kg)  BMI 25.79 kg/m2  SpO2 100% Physical Exam  Constitutional: She is oriented to person, place, and time. She appears well-developed and well-nourished. No distress.  HENT:  Head: Normocephalic and atraumatic.  Right Ear: External ear normal.  Left Ear: External ear normal.  Nose: Nose normal.  Mouth/Throat: Oropharynx is clear and moist.  Eyes: Conjunctivae are normal.  Neck: Normal range of motion.  Cardiovascular: Normal rate, regular rhythm, normal heart sounds, intact distal pulses and normal pulses.   Pulses:      Radial pulses are 2+ on the right side, and 2+ on the left side.       Posterior tibial pulses are 2+ on the right side, and 2+ on the left side.  No leg swelling  Pulmonary/Chest: Effort normal and breath sounds normal. No stridor. No respiratory distress. She has no wheezes. She has no rales.  Abdominal: Soft. She exhibits no distension.  Musculoskeletal:  Normal range of motion.  Neurological: She is alert and oriented to person, place, and time. She has normal strength.  Skin: Skin is warm and dry. She is not diaphoretic. No erythema.  Psychiatric: She has a normal mood and affect. Her behavior is normal.  Nursing note and vitals reviewed.   ED Course  Procedures (including critical care time) Labs Review Labs Reviewed  CBC WITH DIFFERENTIAL - Abnormal; Notable for the  following:    Neutrophils Relative % 78 (*)    All other components within normal limits  D-DIMER, QUANTITATIVE - Abnormal; Notable for the following:    D-Dimer, Quant 0.73 (*)    All other components within normal limits  COMPREHENSIVE METABOLIC PANEL  LIPASE, BLOOD  I-STAT TROPOININ, ED  POC URINE PREG, ED    Imaging Review Dg Chest 2 View  01/28/2014   CLINICAL DATA:  Mid chest pain for 6 days, short of breath  EXAM: CHEST  2 VIEW  COMPARISON:  CT 10/29/2012  FINDINGS: Normal mediastinum and cardiac silhouette. Normal pulmonary vasculature. No evidence of effusion, infiltrate, or pneumothorax. No acute bony abnormality.  IMPRESSION: No acute cardiopulmonary process.   Electronically Signed   By: Genevive BiStewart  Edmunds M.D.   On: 01/28/2014 18:58   Ct Angio Chest Pe W/cm &/or Wo Cm  01/28/2014   CLINICAL DATA:  CHEST PAIN ON THE LEFT STARTING LAST WEEK. SHORTNESS OF BREATH.  EXAM: CT ANGIOGRAPHY CHEST WITH CONTRAST  TECHNIQUE: Multidetector CT imaging of the chest was performed using the standard protocol during bolus administration of intravenous contrast. Multiplanar CT image reconstructions and MIPs were obtained to evaluate the vascular anatomy.  CONTRAST:  100mL OMNIPAQUE IOHEXOL 350 MG/ML SOLN  COMPARISON:  10/29/2012  FINDINGS: THORACIC INLET/BODY WALL:  No acute abnormality.  MEDIASTINUM:  Normal heart size. No pericardial effusion. No acute vascular abnormality, including pulmonary embolism or aortic dissection. No adenopathy.  LUNG WINDOWS:  No consolidation. No effusion. Stable 6 mm nodule at the right apex, likely postinflammatory.  UPPER ABDOMEN:  No acute findings.  OSSEOUS:  No acute fracture.  No suspicious lytic or blastic lesions.  Review of the MIP images confirms the above findings.  IMPRESSION: No evidence of pulmonary embolism or other cause of chest pain.   Electronically Signed   By: Tiburcio PeaJonathan  Watts M.D.   On: 01/28/2014 21:57     EKG Interpretation   Date/Time:   Wednesday January 28 2014 17:29:57 EST Ventricular Rate:  85 PR Interval:  161 QRS Duration: 66 QT Interval:  374 QTC Calculation: 445 R Axis:   54 Text Interpretation:  Sinus rhythm Low voltage, precordial leads No  significant change since last tracing Confirmed by Mirian MoGentry, Matthew [phone]([id number])  on 01/28/2014 6:29:08 PM      MDM   Final diagnoses:  Chest pain  Atrial fibrillation, unspecified   Patient presents emergency Department and from urgent care. Patient was in atrial fibrillation at urgent care with chest pain and shortness of breath. She converted to normal sinus rhythm en route. Symptoms have resolved. Patient with unremarkable labs other than elevated d-dimer. Patient with unremarkable CT angio of chest. No pulmonary embolism. Patient was encouraged to follow up with cardiology. Discussed reasons to return to ED immediately. Vital signs stable for discharge. Dr. Littie DeedsGentry evaluated patient and agrees with plan. Patient / Family / Caregiver informed of clinical course, understand medical decision-making process, and agree with plan.     Mora BellmanHannah S Alexandro Line, PA-C 01/31/14 1016  Mirian MoMatthew Gentry, MD 02/03/14 1640

## 2014-01-28 NOTE — ED Provider Notes (Signed)
CSN: [id number]     Arrival date & time 01/28/14  1547 History   None    Chief Complaint  Patient presents with  . Chest Pain   (Consider location/radiation/quality/duration/timing/severity/associated sxs/prior Treatment) HPI         23 year old female with a history of atrial fibrillation presents complaining of chest pain, lightheadedness, palpitations, shortness of breath, nausea, vomiting. She started to have chest pain earlier in the week, about 2 days ago. It has gotten slightly better, but about one hour ago she started to experience heavy palpitations in her chest with weakness, fatigue, nausea, vomiting, diarrhea. Right now her chest pain is not as bad. She feels very lightheaded. She is not currently taking her metoprolol that she has been given in the past for atrial fibrillation because it made her too tired.  Past Medical History  Diagnosis Date  . Anxiety   . A-fib    History reviewed. No pertinent past surgical history. Family History  Problem Relation Age of Onset  . Heart disease Mother     Atrial fibrillation   History  Substance Use Topics  . Smoking status: Never Smoker   . Smokeless tobacco: Not on file  . Alcohol Use: Yes     Comment: Occasional   OB History    No data available     Review of Systems  Constitutional: Negative for fever and chills.  HENT: Negative for congestion.   Respiratory: Positive for shortness of breath. Negative for cough, chest tightness and wheezing.   Cardiovascular: Positive for chest pain and palpitations. Negative for leg swelling.  Gastrointestinal: Positive for nausea, vomiting and diarrhea. Negative for abdominal pain.  Endocrine: Negative for polydipsia and polyuria.  Musculoskeletal: Negative for myalgias and arthralgias.  Skin: Negative for rash.  All other systems reviewed and are negative.   Allergies  Review of patient's allergies indicates no known allergies.  Home Medications   Prior to Admission  medications   Medication Sig Start Date End Date Taking? Authorizing Provider  ibuprofen (ADVIL,MOTRIN) 400 MG tablet Take 1 tablet (400 mg total) by mouth every 6 (six) hours as needed. 06/24/13   Derwood KaplanAnkit Nanavati, MD  LORazepam (ATIVAN) 1 MG tablet Take 1 tablet (1 mg total) by mouth 3 (three) times daily as needed for anxiety. 06/24/13   Ankit Nanavati, MD   BP 126/85 mmHg  Temp(Src) 98.6 F (37 C)  SpO2 100% Physical Exam  Constitutional: She is oriented to person, place, and time. Vital signs are normal. She appears well-developed and well-nourished. No distress.  HENT:  Head: Normocephalic and atraumatic.  Neck: Normal range of motion. Neck supple. No tracheal deviation present.  Cardiovascular: Normal rate, normal heart sounds and normal pulses.  An irregularly irregular rhythm present.  Pulmonary/Chest: Effort normal and breath sounds normal. No respiratory distress. She has no wheezes. She has no rales.  Neurological: She is alert and oriented to person, place, and time. She has normal strength. Coordination normal.  Skin: Skin is warm and dry. No rash noted. She is not diaphoretic.  Psychiatric: She has a normal mood and affect. Judgment normal.  Nursing note and vitals reviewed.   ED Course  ED EKG  Date/Time: 01/28/2014 4:35 PM Performed by: Autumn MessingBAKER, Makinzey Banes, H Authorized by: Autumn MessingBAKER, Sye Schroepfer, H Rhythm: atrial fibrillation Rate: normal QRS axis: normal Conduction: conduction normal ST Segments: ST segments normal T Waves: T waves normal Other: no other findings Clinical impression: abnormal ECG   (including critical care time) Labs Review Labs Reviewed -  No data to display  Imaging Review No results found.   MDM   1. Atrial fibrillation, unspecified   2. Other chest pain   3. Palpitations   4. SOB (shortness of breath)   5. Nausea and vomiting, vomiting of unspecified type    Patient symptoms are consistent with acute heart failure due to the atrial  fibrillation. She does not have any rales or peripheral edema but this only began about one hour ago so that is not surprising. She is being transferred to the emergency department via EMS. Patient placed on oxygen, IV access obtained. Cardiac monitoring.  SOB improved on 2L nasal cannula     Graylon GoodZachary H Nataly Pacifico, PA-C 01/28/14 1731

## 2014-01-28 NOTE — ED Notes (Signed)
C/o constant CP onset Monday; reports hx of Afib Sx also include: feeling light headed, SOB Has been to ER for similar sx Alert, no signs of acute distress.

## 2014-04-28 ENCOUNTER — Encounter: Payer: Self-pay | Admitting: *Deleted

## 2014-04-28 ENCOUNTER — Encounter (HOSPITAL_COMMUNITY): Payer: Self-pay | Admitting: Emergency Medicine

## 2014-04-28 ENCOUNTER — Emergency Department (HOSPITAL_COMMUNITY)
Admission: EM | Admit: 2014-04-28 | Discharge: 2014-04-28 | Discharge status: Against Medical Advice | Payer: BLUE CROSS/BLUE SHIELD | Attending: Emergency Medicine | Admitting: Emergency Medicine

## 2014-04-28 ENCOUNTER — Emergency Department (HOSPITAL_COMMUNITY): Payer: BLUE CROSS/BLUE SHIELD

## 2014-04-28 ENCOUNTER — Ambulatory Visit (INDEPENDENT_AMBULATORY_CARE_PROVIDER_SITE_OTHER): Payer: BLUE CROSS/BLUE SHIELD | Admitting: Cardiology

## 2014-04-28 ENCOUNTER — Encounter: Payer: Self-pay | Admitting: Cardiology

## 2014-04-28 DIAGNOSIS — R0602 Shortness of breath: Secondary | ICD-10-CM | POA: Diagnosis not present

## 2014-04-28 DIAGNOSIS — Z8679 Personal history of other diseases of the circulatory system: Secondary | ICD-10-CM | POA: Diagnosis not present

## 2014-04-28 DIAGNOSIS — R7989 Other specified abnormal findings of blood chemistry: Secondary | ICD-10-CM

## 2014-04-28 DIAGNOSIS — I4891 Unspecified atrial fibrillation: Secondary | ICD-10-CM

## 2014-04-28 DIAGNOSIS — I480 Paroxysmal atrial fibrillation: Secondary | ICD-10-CM

## 2014-04-28 DIAGNOSIS — Z331 Pregnant state, incidental: Secondary | ICD-10-CM | POA: Insufficient documentation

## 2014-04-28 DIAGNOSIS — R791 Abnormal coagulation profile: Secondary | ICD-10-CM | POA: Insufficient documentation

## 2014-04-28 DIAGNOSIS — Z8659 Personal history of other mental and behavioral disorders: Secondary | ICD-10-CM | POA: Diagnosis not present

## 2014-04-28 DIAGNOSIS — R630 Anorexia: Secondary | ICD-10-CM | POA: Insufficient documentation

## 2014-04-28 DIAGNOSIS — R079 Chest pain, unspecified: Secondary | ICD-10-CM | POA: Diagnosis not present

## 2014-04-28 DIAGNOSIS — Z3201 Encounter for pregnancy test, result positive: Secondary | ICD-10-CM | POA: Insufficient documentation

## 2014-04-28 LAB — CBC WITH DIFFERENTIAL/PLATELET
BASOS ABS: 0 10*3/uL (ref 0.0–0.1)
BASOS PCT: 0 % (ref 0–1)
Eosinophils Absolute: 0 10*3/uL (ref 0.0–0.7)
Eosinophils Relative: 0 % (ref 0–5)
HCT: 41.8 % (ref 36.0–46.0)
Hemoglobin: 13.9 g/dL (ref 12.0–15.0)
Lymphocytes Relative: 31 % (ref 12–46)
Lymphs Abs: 2.2 10*3/uL (ref 0.7–4.0)
MCH: 27.9 pg (ref 26.0–34.0)
MCHC: 33.3 g/dL (ref 30.0–36.0)
MCV: 83.8 fL (ref 78.0–100.0)
Monocytes Absolute: 0.5 10*3/uL (ref 0.1–1.0)
Monocytes Relative: 7 % (ref 3–12)
NEUTROS ABS: 4.5 10*3/uL (ref 1.7–7.7)
Neutrophils Relative %: 62 % (ref 43–77)
PLATELETS: 283 10*3/uL (ref 150–400)
RBC: 4.99 MIL/uL (ref 3.87–5.11)
RDW: 12.5 % (ref 11.5–15.5)
WBC: 7.2 10*3/uL (ref 4.0–10.5)

## 2014-04-28 LAB — HEPATIC FUNCTION PANEL
ALBUMIN: 3.7 g/dL (ref 3.5–5.2)
ALT: 12 U/L (ref 0–35)
AST: 16 U/L (ref 0–37)
Alkaline Phosphatase: 42 U/L (ref 39–117)
Bilirubin, Direct: 0.2 mg/dL (ref 0.0–0.5)
Indirect Bilirubin: 0.3 mg/dL (ref 0.3–0.9)
Total Bilirubin: 0.5 mg/dL (ref 0.3–1.2)
Total Protein: 6.9 g/dL (ref 6.0–8.3)

## 2014-04-28 LAB — BASIC METABOLIC PANEL
Anion gap: 8 (ref 5–15)
BUN: 11 mg/dL (ref 6–23)
CO2: 23 mmol/L (ref 19–32)
Calcium: 9.5 mg/dL (ref 8.4–10.5)
Chloride: 103 mmol/L (ref 96–112)
Creatinine, Ser: 0.77 mg/dL (ref 0.50–1.10)
GFR calc Af Amer: >90 mL/min (ref >90)
Glucose, Bld: 88 mg/dL (ref 70–99)
Potassium: 4 mmol/L (ref 3.5–5.1)
SODIUM: 134 mmol/L — AB (ref 135–145)

## 2014-04-28 LAB — URINALYSIS, ROUTINE W REFLEX MICROSCOPIC
Bilirubin Urine: NEGATIVE
Glucose, UA: NEGATIVE mg/dL
Hgb urine dipstick: NEGATIVE
Ketones, ur: >80 mg/dL — AB
Nitrite: NEGATIVE
PH: 6 (ref 5.0–8.0)
Protein, ur: NEGATIVE mg/dL
SPECIFIC GRAVITY, URINE: 1.027 (ref 1.005–1.030)
Urobilinogen, UA: 0.2 mg/dL (ref 0.0–1.0)

## 2014-04-28 LAB — I-STAT TROPONIN, ED: Troponin i, poc: 0 ng/mL (ref 0.00–0.08)

## 2014-04-28 LAB — I-STAT BETA HCG BLOOD, ED (MC, WL, AP ONLY): I-stat hCG, quantitative: >2000 m[IU]/mL — ABNORMAL HIGH (ref <5)

## 2014-04-28 LAB — URINE MICROSCOPIC-ADD ON

## 2014-04-28 LAB — D-DIMER, QUANTITATIVE (NOT AT ARMC): D DIMER QUANT: 0.98 ug{FEU}/mL — AB (ref 0.00–0.48)

## 2014-04-28 LAB — LIPASE, BLOOD: LIPASE: 25 U/L (ref 11–59)

## 2014-04-28 LAB — POC URINE PREG, ED: PREG TEST UR: POSITIVE — AB

## 2014-04-28 MED ORDER — METOPROLOL SUCCINATE ER 25 MG PO TB24: 25.0000 mg | ORAL_TABLET | Freq: Every day | ORAL | Status: DC

## 2014-04-28 MED ORDER — GI COCKTAIL ~~LOC~~
30.0000 mL | Freq: Once | ORAL | Status: AC
  Administered 2014-04-28: 30 mL via ORAL
  Filled 2014-04-28: qty 30

## 2014-04-28 NOTE — Assessment & Plan Note (Signed)
Add Toprol 25 mg by mouth daily at bedtime. Note she is also having some issues with early satiety. I have asked her to establish with a primary care physician for further evaluation.

## 2014-04-28 NOTE — Progress Notes (Signed)
      HPI: FU atrial fibrillation. Laboratories in August of 2014 showed a TSH of 0.596. Patient seen in the emergency room 8/14 with complaints of diarrhea. She also described dizziness and chest tightness as well as palpitations. Patient noted to be in atrial fibrillation. Patient given a prescription for metoprolol. Echocardiogram August 2014 showed normal LV function. Chest CT November 2015 showed no pulmonary embolus. Seen in the emergency room November 2015 with chest pain and afib. Troponin normal. Hemoglobin and potassium normal. Since last seen, she complains of chest pain. She has had intermittent pain since 2013. The pain is in the left breast area. It starts as a sharp pain and then turns to a pressure. It can last all day. It is improved with pressing on her chest. She occasionally has dyspnea not related to activities. Occasional brief palpitations. No syncope.  No current outpatient prescriptions on file.   No current facility-administered medications for this visit.     Past Medical History  Diagnosis Date  . Anxiety   . A-fib     History reviewed. No pertinent past surgical history.  History   Social History  . Marital Status: Single    Spouse Name: N/A    Number of Children: N/A  . Years of Education: N/A   Occupational History  . Not on file.   Social History Main Topics  . Smoking status: Never Smoker   . Smokeless tobacco: Not on file  . Alcohol Use: Yes     Comment: Occasional  . Drug Use: Yes    Special: Marijuana  . Sexual Activity: Not on file   Other Topics Concern  . Not on file   Social History Narrative    ROS: no fevers or chills, productive cough, hemoptysis, dysphasia, odynophagia, melena, hematochezia, dysuria, hematuria, rash, seizure activity, orthopnea, PND, pedal edema, claudication. Remaining systems are negative.  Physical Exam: Well-developed well-nourished in no acute distress.  Skin is warm and dry.  HEENT is normal.  Neck  is supple.  Chest is clear to auscultation with normal expansion.  Cardiovascular exam is regular rate and rhythm.  Abdominal exam nontender or distended. No masses palpated. Extremities show no edema. neuro grossly intact  ECG sinus rhythm at a rate of 85. Low voltage.

## 2014-04-28 NOTE — Assessment & Plan Note (Signed)
Patient is in sinus rhythm today. I will add Toprol 25 mg by mouth daily at bedtime. She does not have embolic risk factors other than female sex. Repeat TSH. Repeat echocardiogram. We can consider an antiarrhythmic in the future if her episodes are more frequent.

## 2014-04-28 NOTE — ED Notes (Signed)
Pt c/o chest tightness x several weeks and no appetite; pt sts hx of afib in past

## 2014-04-28 NOTE — Patient Instructions (Signed)
Your physician wants you to follow-up in: 6 MONTHS WITH DR Jens SomRENSHAW You will receive a reminder letter in the mail two months in advance. If you don't receive a letter, please call our office to schedule the follow-up appointment.   Your physician has requested that you have an echocardiogram. Echocardiography is a painless test that uses sound waves to create images of your heart. It provides your doctor with information about the size and shape of your heart and how well your heart's chambers and valves are working. This procedure takes approximately one hour. There are no restrictions for this procedure.   Your physician recommends that you HAVE LAB WORK TODAY  START METOPROLOL SUCC ER 25 MG ONCE DAILY AT BEDTIME

## 2014-04-28 NOTE — Assessment & Plan Note (Signed)
Symptoms are atypical.they do not sound consistent with cardiac etiology. Echocardiogram to assess LV function. Recent CT negative for pulmonary embolus.

## 2014-04-28 NOTE — ED Notes (Signed)
Pt demanding to leave AMA. PA at bedside to discuss.

## 2014-04-28 NOTE — ED Provider Notes (Signed)
CSN: [id number]     Arrival date & time 04/28/14  1318 History   First MD Initiated Contact with Patient 04/28/14 1646     Chief Complaint  Patient presents with  . Chest Pain     (Consider location/radiation/quality/duration/timing/severity/associated sxs/prior Treatment) HPI Nicole Flynn is a 24 y.o. female with hx of anxiety and afib, presents to ED today complaining of chest pain, sob, weakness, loss of appetite. States food tastes bad. States she lost 7 lb in the last few weeks. She reports consistent chest tightness, worse with exertion. States shortness of breath is with exertion as well. She reports intermittent retrosternal sharp chest pain that comes and goes. She went and saw her cardiologist this morning who did an EKG and ordered TSH. He told her that her EKG was normal and he did not have an explanation for chest pain. She decided to come to emergency department for further evaluation given she continues to have symptoms. She denies any recent illnesses. No cough or congestion. No fever or chills. Pain is not pleuritic. She is on birth control patch. She is not a smoker. No recent travel surgeries. No extremity swelling.  Past Medical History  Diagnosis Date  . Anxiety   . A-fib    History reviewed. No pertinent past surgical history. Family History  Problem Relation Age of Onset  . Heart disease Mother     Atrial fibrillation   History  Substance Use Topics  . Smoking status: Never Smoker   . Smokeless tobacco: Not on file  . Alcohol Use: Yes     Comment: Occasional   OB History    No data available     Review of Systems  Constitutional: Positive for appetite change. Negative for fever and chills.  Respiratory: Positive for chest tightness and shortness of breath. Negative for cough.   Cardiovascular: Positive for chest pain. Negative for palpitations and leg swelling.  Gastrointestinal: Negative for nausea, vomiting, abdominal pain and diarrhea.  Genitourinary:  Negative for dysuria, flank pain and pelvic pain.  Musculoskeletal: Negative for myalgias, arthralgias, neck pain and neck stiffness.  Skin: Negative for rash.  Neurological: Negative for dizziness, weakness and headaches.  All other systems reviewed and are negative.     Allergies  Review of patient's allergies indicates no known allergies.  Home Medications   Prior to Admission medications   Medication Sig Start Date End Date Taking? Authorizing Provider  metoprolol succinate (TOPROL-XL) 25 MG 24 hr tablet Take 1 tablet (25 mg total) by mouth daily. 04/28/14   Lewayne BuntingBrian S Crenshaw, MD   BP 115/80 mmHg  Pulse 77  Temp(Src) 98.3 F (36.8 C) (Oral)  Resp 18  SpO2 100% Physical Exam  Constitutional: She is oriented to person, place, and time. She appears well-developed and well-nourished. No distress.  HENT:  Head: Normocephalic.  Eyes: Conjunctivae are normal.  Neck: Neck supple.  Cardiovascular: Normal rate, regular rhythm and normal heart sounds.   Pulmonary/Chest: Effort normal and breath sounds normal. No respiratory distress. She has no wheezes. She has no rales. She exhibits no tenderness.  Abdominal: Soft. Bowel sounds are normal. She exhibits no distension. There is no tenderness. There is no rebound.  Musculoskeletal: She exhibits no edema.  Neurological: She is alert and oriented to person, place, and time.  Skin: Skin is warm and dry.  Psychiatric: She has a normal mood and affect. Her behavior is normal.  Nursing note and vitals reviewed.   ED Course  Procedures (including critical care  time) Labs Review Labs Reviewed  BASIC METABOLIC PANEL - Abnormal; Notable for the following:    Sodium 134 (*)    All other components within normal limits  CBC WITH DIFFERENTIAL/PLATELET  D-DIMER, QUANTITATIVE  HEPATIC FUNCTION PANEL  LIPASE, BLOOD  URINALYSIS, ROUTINE W REFLEX MICROSCOPIC  I-STAT TROPOININ, ED  POC URINE PREG, ED  POC URINE PREG, ED    Imaging  Review Dg Chest 2 View  04/28/2014   CLINICAL DATA:  Chest tightness.  EXAM: CHEST  2 VIEW  COMPARISON:  None.  FINDINGS: Mediastinum and hilar structures normal. Lungs are clear. Heart size normal. No pleural effusion or pneumothorax.  IMPRESSION: No acute cardiopulmonary disease.   Electronically Signed   By: Maisie Fus  Register   On: 04/28/2014 13:48     EKG Interpretation   Date/Time:  Tuesday April 28 2014 13:20:36 EST Ventricular Rate:  78 PR Interval:  142 QRS Duration: 68 QT Interval:  360 QTC Calculation: 410 R Axis:   75 Text Interpretation:  Normal sinus rhythm with sinus arrhythmia Low  voltage QRS Cannot rule out Anterior infarct , age undetermined Abnormal  ECG No significant change was found Confirmed by CAMPOS  MD, Caryn Bee ([id number])  on 04/28/2014 5:10:37 PM      MDM   Final diagnoses:  Chest pain, unspecified chest pain type  Positive pregnancy test  Elevated d-dimer    Patient emergency department with chest tightness, exertional shortness of breath, history of A. fib. Just see all her cardiologist this morning, sinus rhythm on EKG, metoprolol was increased in the office. TSH pending. Will get labs and EKG, chest x-ray.  6:27 PM EKG shows some normal sinus rhythm. Vital signs are within normal. Unable to rule out PE, patient is on birth control, recently switched to a new type of birth control, patch, 2 weeks ago. Will get a d-dimer.  D-dimer elevated. Pregnancy test is positive. Will confirm with i-STAT hCG. Patient states she did miss. In January, but had negative pregnancy test by her OB/GYN 2 weeks ago. Pt will need CT angio for further evaluation of PE. Risk factors are pregnancy and birth control.   Pt did not want to wait for CT angio. Discussed at length that i cannot rule out PE and that it is potentially life threatening. Pt understands. She stated she will return if symptoms persist or worsening. Advised to start tylenol for pain. Prenatal vitamins. Follow  up with ob/gyn.   Filed Vitals:   04/28/14 2045 04/28/14 2100 04/28/14 2115 04/28/14 2130  BP: 116/66 109/70 117/76 111/62  Pulse: 76 77 79 73  Temp:      TempSrc:      Resp: SpO2: 100% 98% 99% 99%     Lottie Mussel, PA-C 04/28/14 2310  Lyanne Co, MD 04/29/14 1445

## 2014-04-30 ENCOUNTER — Ambulatory Visit (HOSPITAL_COMMUNITY): Payer: BLUE CROSS/BLUE SHIELD

## 2014-05-05 ENCOUNTER — Telehealth (HOSPITAL_COMMUNITY): Payer: Self-pay | Admitting: *Deleted

## 2014-05-20 ENCOUNTER — Other Ambulatory Visit: Payer: Self-pay | Admitting: Obstetrics & Gynecology

## 2014-05-20 ENCOUNTER — Other Ambulatory Visit (HOSPITAL_COMMUNITY)
Admission: RE | Admit: 2014-05-20 | Discharge: 2014-05-20 | Disposition: A | Discharge status: Home / Self Care | Payer: BLUE CROSS/BLUE SHIELD | Source: Ambulatory Visit | Attending: Obstetrics & Gynecology | Admitting: Obstetrics & Gynecology

## 2014-05-20 ENCOUNTER — Telehealth: Payer: Self-pay | Admitting: Cardiology

## 2014-05-20 DIAGNOSIS — Z01419 Encounter for gynecological examination (general) (routine) without abnormal findings: Secondary | ICD-10-CM | POA: Diagnosis present

## 2014-05-20 DIAGNOSIS — Z113 Encounter for screening for infections with a predominantly sexual mode of transmission: Secondary | ICD-10-CM | POA: Insufficient documentation

## 2014-05-20 NOTE — Telephone Encounter (Signed)
Nicole Flynn called in stating that the pt is newly pregnant Dr. Myna HidalgoJennifer Ozan would like for her to be seen by Dr. Jens Somrenshaw about discontinuing her Toprol. She would like to know if this is a medication should be continued. Please call   Thanks

## 2014-05-20 NOTE — Telephone Encounter (Signed)
Will forward for dr crenshaw review  

## 2014-05-21 LAB — CYTOLOGY - PAP

## 2014-05-21 NOTE — Telephone Encounter (Signed)
Ok to DC toprol for now [facility]ite AidBrian Crenshaw

## 2014-05-21 NOTE — Telephone Encounter (Signed)
Spoke with Kathlen Modyariel, Aware of dr Ludwig Clarkscrenshaw's recommendations.  She is going to contact the patient. She will call back if the patient needs to be seen.

## 2014-05-22 ENCOUNTER — Telehealth: Payer: Self-pay | Admitting: Cardiology

## 2014-05-25 NOTE — Telephone Encounter (Signed)
Close encounter 

## 2014-06-06 ENCOUNTER — Encounter (HOSPITAL_COMMUNITY): Payer: Self-pay | Admitting: Nurse Practitioner

## 2014-06-06 ENCOUNTER — Emergency Department (HOSPITAL_COMMUNITY)
Admission: EM | Admit: 2014-06-06 | Discharge: 2014-06-06 | Disposition: A | Discharge status: Home / Self Care | Payer: BLUE CROSS/BLUE SHIELD | Attending: Emergency Medicine | Admitting: Emergency Medicine

## 2014-06-06 DIAGNOSIS — Z8659 Personal history of other mental and behavioral disorders: Secondary | ICD-10-CM | POA: Insufficient documentation

## 2014-06-06 DIAGNOSIS — Z79899 Other long term (current) drug therapy: Secondary | ICD-10-CM | POA: Diagnosis not present

## 2014-06-06 DIAGNOSIS — Z3A12 12 weeks gestation of pregnancy: Secondary | ICD-10-CM | POA: Insufficient documentation

## 2014-06-06 DIAGNOSIS — Z792 Long term (current) use of antibiotics: Secondary | ICD-10-CM | POA: Insufficient documentation

## 2014-06-06 DIAGNOSIS — R112 Nausea with vomiting, unspecified: Secondary | ICD-10-CM

## 2014-06-06 DIAGNOSIS — O210 Mild hyperemesis gravidarum: Secondary | ICD-10-CM | POA: Diagnosis present

## 2014-06-06 LAB — COMPREHENSIVE METABOLIC PANEL
ALBUMIN: 3.5 g/dL (ref 3.5–5.2)
ALK PHOS: 44 U/L (ref 39–117)
ALT: 16 U/L (ref 0–35)
AST: 20 U/L (ref 0–37)
Anion gap: 6 (ref 5–15)
BILIRUBIN TOTAL: 0.9 mg/dL (ref 0.3–1.2)
BUN: 9 mg/dL (ref 6–23)
CALCIUM: 9.3 mg/dL (ref 8.4–10.5)
CO2: 24 mmol/L (ref 19–32)
CREATININE: 0.63 mg/dL (ref 0.50–1.10)
Chloride: 103 mmol/L (ref 96–112)
GFR calc Af Amer: >90 mL/min (ref >90)
GLUCOSE: 94 mg/dL (ref 70–99)
Potassium: 3.7 mmol/L (ref 3.5–5.1)
Sodium: 133 mmol/L — ABNORMAL LOW (ref 135–145)
TOTAL PROTEIN: 6.6 g/dL (ref 6.0–8.3)

## 2014-06-06 LAB — CBC WITH DIFFERENTIAL/PLATELET
BASOS ABS: 0 10*3/uL (ref 0.0–0.1)
BASOS PCT: 0 % (ref 0–1)
EOS ABS: 0 10*3/uL (ref 0.0–0.7)
Eosinophils Relative: 0 % (ref 0–5)
HEMATOCRIT: 34.1 % — AB (ref 36.0–46.0)
Hemoglobin: 11.3 g/dL — ABNORMAL LOW (ref 12.0–15.0)
Lymphocytes Relative: 13 % (ref 12–46)
Lymphs Abs: 1.1 10*3/uL (ref 0.7–4.0)
MCH: 27 pg (ref 26.0–34.0)
MCHC: 33.1 g/dL (ref 30.0–36.0)
MCV: 81.4 fL (ref 78.0–100.0)
MONO ABS: 0.4 10*3/uL (ref 0.1–1.0)
MONOS PCT: 5 % (ref 3–12)
Neutro Abs: 7 10*3/uL (ref 1.7–7.7)
Neutrophils Relative %: 82 % — ABNORMAL HIGH (ref 43–77)
PLATELETS: 241 10*3/uL (ref 150–400)
RBC: 4.19 MIL/uL (ref 3.87–5.11)
RDW: 12.1 % (ref 11.5–15.5)
WBC: 8.5 10*3/uL (ref 4.0–10.5)

## 2014-06-06 LAB — URINALYSIS, ROUTINE W REFLEX MICROSCOPIC
Bilirubin Urine: NEGATIVE
Glucose, UA: NEGATIVE mg/dL
Hgb urine dipstick: NEGATIVE
Ketones, ur: 15 mg/dL — AB
NITRITE: NEGATIVE
PH: 6 (ref 5.0–8.0)
Protein, ur: NEGATIVE mg/dL
Specific Gravity, Urine: 1.014 (ref 1.005–1.030)
Urobilinogen, UA: 0.2 mg/dL (ref 0.0–1.0)

## 2014-06-06 LAB — BRAIN NATRIURETIC PEPTIDE: B Natriuretic Peptide: 23.6 pg/mL (ref 0.0–100.0)

## 2014-06-06 LAB — URINE MICROSCOPIC-ADD ON

## 2014-06-06 LAB — POC URINE PREG, ED: Preg Test, Ur: POSITIVE — AB

## 2014-06-06 MED ORDER — SODIUM CHLORIDE 0.9 % IV BOLUS (SEPSIS)
1000.0000 mL | Freq: Once | INTRAVENOUS | Status: AC
  Administered 2014-06-06: 1000 mL via INTRAVENOUS

## 2014-06-06 MED ORDER — PROMETHAZINE HCL 25 MG PO TABS: 25.0000 mg | ORAL_TABLET | Freq: Four times a day (QID) | ORAL | Status: DC | PRN

## 2014-06-06 MED ORDER — ONDANSETRON HCL 4 MG/2ML IJ SOLN
4.0000 mg | Freq: Once | INTRAMUSCULAR | Status: AC
  Administered 2014-06-06: 4 mg via INTRAVENOUS
  Filled 2014-06-06: qty 2

## 2014-06-06 NOTE — ED Provider Notes (Signed)
CSN: [id number]     Arrival date & time 06/06/14  1551 History   First MD Initiated Contact with Patient 06/06/14 1702     Chief Complaint  Patient presents with  . Nausea      HPI  Patient presents evaluation of pregnancy and vomiting.   She is [redacted] weeks pregnant by her dates.  Pregnancy has been uncomplicated other than nausea. Has been responding to an over-the-counter antiemetic. Has had OB appointment and normal ultrasound. Vomiting starting early this morning. Unable to keep down fluids. Has developed lower abdominal cramping. No vaginal bleeding or discharge.  Past Medical History  Diagnosis Date  . Anxiety   . A-fib    History reviewed. No pertinent past surgical history. Family History  Problem Relation Age of Onset  . Heart disease Mother     Atrial fibrillation   History  Substance Use Topics  . Smoking status: Never Smoker   . Smokeless tobacco: Not on file  . Alcohol Use: Yes     Comment: Occasional   OB History    Gravida Para Term Preterm AB TAB SAB Ectopic Multiple Living   1              Review of Systems  Constitutional: Negative for fever, chills, diaphoresis, appetite change and fatigue.  HENT: Negative for mouth sores, sore throat and trouble swallowing.   Eyes: Negative for visual disturbance.  Respiratory: Negative for cough, chest tightness, shortness of breath and wheezing.   Cardiovascular: Negative for chest pain.  Gastrointestinal: Positive for nausea and vomiting. Negative for abdominal pain, diarrhea and abdominal distention.  Endocrine: Negative for polydipsia, polyphagia and polyuria.  Genitourinary: Negative for dysuria, frequency and hematuria.  Musculoskeletal: Negative for gait problem.  Skin: Negative for color change, pallor and rash.  Neurological: Negative for dizziness, syncope, light-headedness and headaches.  Hematological: Does not bruise/bleed easily.  Psychiatric/Behavioral: Negative for behavioral problems and confusion.       Allergies  Review of patient's allergies indicates no known allergies.  Home Medications   Prior to Admission medications   Medication Sig Start Date End Date Taking? Authorizing Provider  fluconazole (DIFLUCAN) 150 MG tablet  05/22/14   Historical Provider, MD  metoprolol succinate (TOPROL-XL) 25 MG 24 hr tablet Take 1 tablet (25 mg total) by mouth daily. 04/28/14   Lewayne Bunting, MD  metroNIDAZOLE (FLAGYL) 500 MG tablet  04/08/14   Historical Provider, MD  ondansetron (ZOFRAN-ODT) 4 MG disintegrating tablet  04/30/14   Historical Provider, MD  terconazole (TERAZOL 7) 0.4 % vaginal cream  05/01/14   Historical Provider, MD  Burr Medico 150-35 MCG/24HR transdermal patch Place 1 patch onto the skin once a week.  04/12/14   Historical Provider, MD   BP 101/58 mmHg  Pulse 77  Temp(Src) 98.7 F (37.1 C) (Oral)  Resp 19  SpO2 100%  LMP 03/11/2014 Physical Exam  Constitutional: She is oriented to person, place, and time. She appears well-developed and well-nourished. No distress.  HENT:  Head: Normocephalic.  Eyes: Conjunctivae are normal. Pupils are equal, round, and reactive to light. No scleral icterus.  Neck: Normal range of motion. Neck supple. No thyromegaly present.  Cardiovascular: Normal rate and regular rhythm.  Exam reveals no gallop and no friction rub.   No murmur heard. Pulmonary/Chest: Effort normal. No respiratory distress. She has no rales.  Abdominal: Soft. Bowel sounds are normal. She exhibits no distension. There is no tenderness. There is no rebound.  Soft benign abdomen. Normal active  bowel sounds.  Musculoskeletal: Normal range of motion.  Neurological: She is alert and oriented to person, place, and time.  Skin: Skin is warm and dry. No rash noted.  Psychiatric: She has a normal mood and affect. Her behavior is normal.    ED Course  Procedures (including critical care time) Labs Review Labs Reviewed  CBC WITH DIFFERENTIAL/PLATELET - Abnormal; Notable for  the following:    Hemoglobin 11.3 (*)    HCT 34.1 (*)    Neutrophils Relative % 82 (*)    All other components within normal limits  COMPREHENSIVE METABOLIC PANEL - Abnormal; Notable for the following:    Sodium 133 (*)    All other components within normal limits  URINALYSIS, ROUTINE W REFLEX MICROSCOPIC - Abnormal; Notable for the following:    APPearance CLOUDY (*)    Ketones, ur 15 (*)    Leukocytes, UA TRACE (*)    All other components within normal limits  URINE MICROSCOPIC-ADD ON - Abnormal; Notable for the following:    Squamous Epithelial / LPF FEW (*)    Bacteria, UA FEW (*)    All other components within normal limits  POC URINE PREG, ED - Abnormal; Notable for the following:    Preg Test, Ur POSITIVE (*)    All other components within normal limits  BRAIN NATRIURETIC PEPTIDE    Imaging Review No results found.   EKG Interpretation None      MDM   Final diagnoses:  Non-intractable vomiting with nausea, vomiting of unspecified type    Pt given IV fluids. Given antiemetics. No abnormalities were Ultram. Urine does not appear infected. After hydration her symptoms are improving. Plan is home, continue antiemetics, OB follow-up as needed.    Rolland PorterMark Gwenn Teodoro, MD 06/06/14 2006

## 2014-06-06 NOTE — Discharge Instructions (Signed)

## 2014-06-06 NOTE — ED Notes (Addendum)
She reports n/v/body aches since early this morning. She is [redacted] weeks pregnant. States she has had some nausea with pregnancy but today she can not tolerate any oral intake and was unable to hold down her nausea medication. She states she has been having palpitations today

## 2014-06-14 NOTE — Progress Notes (Signed)
      HPI: FU atrial fibrillation. Laboratories in August of 2014 showed a TSH of 0.596. Patient seen in the emergency room 8/14 with complaints of diarrhea. She also described dizziness and chest tightness as well as palpitations. Patient noted to be in atrial fibrillation. Patient given a prescription for metoprolol. Echocardiogram August 2014 showed normal LV function. Chest CT November 2015 showed no pulmonary embolus. Seen in the emergency room November 2015 with chest pain and afib. Troponin normal. Hemoglobin and potassium normal. Since last seen, she continues to have occasional nonexertional chest pain. No dyspnea. Palpitations have improved. Patient is now pregnant.  Current Outpatient Prescriptions  Medication Sig Dispense Refill  . acetaminophen (TYLENOL) 325 MG tablet Take 325-650 mg by mouth every 6 (six) hours as needed (pain).    . Doxylamine-Pyridoxine 10-10 MG TBEC Take by mouth. Take 1 tablet by mouth in the morning, 1 tablet in the afternoon, and 2 tablets in the evening.    . metoprolol succinate (TOPROL-XL) 25 MG 24 hr tablet Take 1 tablet (25 mg total) by mouth daily. 90 tablet 3  . Prenatal Vit-Fe Fumarate-FA (PRENATAL MULTIVITAMIN) TABS tablet Take 1 tablet by mouth daily at 12 noon.    . promethazine (PHENERGAN) 25 MG tablet Take 1 tablet (25 mg total) by mouth every 6 (six) hours as needed for nausea or vomiting. 10 tablet 0   No current facility-administered medications for this visit.     Past Medical History  Diagnosis Date  . Anxiety   . A-fib     History reviewed. No pertinent past surgical history.  History   Social History  . Marital Status: Single    Spouse Name: N/A  . Number of Children: N/A  . Years of Education: N/A   Occupational History  . Not on file.   Social History Main Topics  . Smoking status: Never Smoker   . Smokeless tobacco: Not on file  . Alcohol Use: Yes     Comment: Occasional  . Drug Use: Yes    Special: Marijuana  .  Sexual Activity: Not on file   Other Topics Concern  . Not on file   Social History Narrative    ROS: no fevers or chills, productive cough, hemoptysis, dysphasia, odynophagia, melena, hematochezia, dysuria, hematuria, rash, seizure activity, orthopnea, PND, pedal edema, claudication. Remaining systems are negative.  Physical Exam: Well-developed well-nourished in no acute distress.  Skin is warm and dry.  HEENT is normal.  Neck is supple.  Chest is clear to auscultation with normal expansion.  Cardiovascular exam is regular rate and rhythm.  Abdominal exam nontender or distended. No masses palpated. Extremities show no edema. neuro grossly intact

## 2014-06-16 ENCOUNTER — Encounter (HOSPITAL_COMMUNITY): Payer: Self-pay | Admitting: *Deleted

## 2014-06-16 ENCOUNTER — Encounter: Payer: Self-pay | Admitting: Cardiology

## 2014-06-16 ENCOUNTER — Inpatient Hospital Stay (HOSPITAL_COMMUNITY)
Admission: AD | Admit: 2014-06-16 | Discharge: 2014-06-17 | Disposition: A | Discharge status: Home / Self Care | Payer: BLUE CROSS/BLUE SHIELD | Source: Ambulatory Visit | Attending: Obstetrics and Gynecology | Admitting: Obstetrics and Gynecology

## 2014-06-16 ENCOUNTER — Inpatient Hospital Stay (HOSPITAL_COMMUNITY): Payer: BLUE CROSS/BLUE SHIELD

## 2014-06-16 ENCOUNTER — Ambulatory Visit (INDEPENDENT_AMBULATORY_CARE_PROVIDER_SITE_OTHER): Payer: BLUE CROSS/BLUE SHIELD | Admitting: Cardiology

## 2014-06-16 VITALS — BP 98/54 | HR 104 | Ht 66.0 in | Wt 118.8 lb

## 2014-06-16 DIAGNOSIS — O021 Missed abortion: Secondary | ICD-10-CM | POA: Insufficient documentation

## 2014-06-16 DIAGNOSIS — I4891 Unspecified atrial fibrillation: Secondary | ICD-10-CM | POA: Insufficient documentation

## 2014-06-16 DIAGNOSIS — O209 Hemorrhage in early pregnancy, unspecified: Secondary | ICD-10-CM

## 2014-06-16 DIAGNOSIS — R002 Palpitations: Secondary | ICD-10-CM | POA: Diagnosis not present

## 2014-06-16 DIAGNOSIS — O4692 Antepartum hemorrhage, unspecified, second trimester: Secondary | ICD-10-CM

## 2014-06-16 DIAGNOSIS — O034 Incomplete spontaneous abortion without complication: Secondary | ICD-10-CM

## 2014-06-16 NOTE — Assessment & Plan Note (Signed)
Symptoms improved compared to previous. We will consider resuming beta-blockade in the future after she delivers.

## 2014-06-16 NOTE — Assessment & Plan Note (Signed)
Symptoms atypical. Will not pursue further ischemia evaluation.

## 2014-06-16 NOTE — MAU Provider Note (Signed)
History     CSN: [id number]  Arrival date and time: 06/16/14 2317   None     Chief Complaint  Patient presents with  . Vaginal Bleeding   HPI  [name] is a 24 y.o. G2P0010 at [redacted]w[redacted]d here with report of scant vaginal bleeding that began one hour ago.  Denies seeing in clots of blood.  No report of pelvic pain.  Hx or hyperemesis this pregnancy.  No nausea and vomiting today.    Past Medical History  Diagnosis Date  . Anxiety   . A-fib     History reviewed. No pertinent past surgical history.  Family History  Problem Relation Age of Onset  . Heart disease Mother     Atrial fibrillation    History  Substance Use Topics  . Smoking status: Never Smoker   . Smokeless tobacco: Not on file  . Alcohol Use: No     Comment: Occasional    Allergies: No Known Allergies  Prescriptions prior to admission  Medication Sig Dispense Refill Last Dose  . acetaminophen (TYLENOL) 325 MG tablet Take 325-650 mg by mouth every 6 (six) hours as needed (pain).   Taking  . Doxylamine-Pyridoxine 10-10 MG TBEC Take by mouth. Take 1 tablet by mouth in the morning, 1 tablet in the afternoon, and 2 tablets in the evening.   Taking  . metoprolol succinate (TOPROL-XL) 25 MG 24 hr tablet Take 1 tablet (25 mg total) by mouth daily. 90 tablet 3 Taking  . Prenatal Vit-Fe Fumarate-FA (PRENATAL MULTIVITAMIN) TABS tablet Take 1 tablet by mouth daily at 12 noon.   Taking  . promethazine (PHENERGAN) 25 MG tablet Take 1 tablet (25 mg total) by mouth every 6 (six) hours as needed for nausea or vomiting. 10 tablet 0 Taking    Review of Systems  Gastrointestinal: Negative for nausea, vomiting and abdominal pain.  Genitourinary: Negative for dysuria, urgency and frequency.       Vaginal bleeding  All other systems reviewed and are negative.  Physical Exam   Blood pressure 111/63, pulse 83, temperature 98.3 F (36.8 C), temperature source Oral, resp. rate 18, height  (1.676 m), weight 53.524 kg  (118 lb), last menstrual period 03/11/2014.  Physical Exam  Constitutional: She is oriented to person, place, and time. She appears well-developed and well-nourished. No distress.  HENT:  Head: Normocephalic.  Neck: Normal range of motion. Neck supple.  Cardiovascular: Normal rate, regular rhythm and normal heart sounds.   Respiratory: Effort normal and breath sounds normal. No respiratory distress.  GI: Soft. There is no tenderness.  Genitourinary: Cervix exhibits no discharge. There is bleeding ( scant) in the vagina.  Cervix visually closed  Neurological: She is alert and oriented to person, place, and time.  Skin: Skin is warm and dry.    MAU Course  Procedures  Ultrasound: Intrauterine gestational sac: Visualized, irregular in shape.  Yolk sac: Not present.  Embryo: Present  Cardiac Activity: Not present  CRL:  50.7 mm  11 w 6 d  Maternal uterus/adnexae: Both ovaries are identified and normal. No pelvic free fluid.  IMPRESSION: Intrauterine pregnancy with irregular gestational sac and no fetal heart tones or cardiac activity. Findings are consistent with fetal demise/nonviable pregnancy.  0010 Dr. Charlotta Newton called and given ultrasound results > have Nicole Flynn keep scheduled appointment for tomorrow to discuss options  Assessment and Plan  24 y.o. G2P0010 w/11 fetal demise  Plan: Reviewed ultrasound results with patient; declined chaplain services.  Keep scheduled appointment for tomorrow  Nicole Flynn, CNM

## 2014-06-16 NOTE — Patient Instructions (Signed)
Your physician wants you to follow-up in: 6 MONTHS WITH DR CRENSHAW You will receive a reminder letter in the mail two months in advance. If you don't receive a letter, please call our office to schedule the follow-up appointment.   Your physician has requested that you have an echocardiogram. Echocardiography is a painless test that uses sound waves to create images of your heart. It provides your doctor with information about the size and shape of your heart and how well your heart's chambers and valves are working. This procedure takes approximately one hour. There are no restrictions for this procedure.   Your physician recommends that you return for lab work WITH ECHO 

## 2014-06-16 NOTE — MAU Note (Signed)
Pt states that she began bleeding an hour ago with some spotting and blood seen when wiping. No clots seen. Pt wore no pad in and a dollar size spot of dark brownish red discharge noted on underwear.

## 2014-06-16 NOTE — Assessment & Plan Note (Signed)
Patient remains in sinus rhythm on examination. Toprol was discontinued because of pregnancy. Repeat echocardiogram and TSH.

## 2014-06-17 ENCOUNTER — Inpatient Hospital Stay (HOSPITAL_COMMUNITY): Payer: BLUE CROSS/BLUE SHIELD

## 2014-06-17 ENCOUNTER — Encounter (HOSPITAL_COMMUNITY): Payer: Self-pay

## 2014-06-17 ENCOUNTER — Encounter (HOSPITAL_COMMUNITY): Payer: Self-pay | Admitting: Family

## 2014-06-17 DIAGNOSIS — N939 Abnormal uterine and vaginal bleeding, unspecified: Secondary | ICD-10-CM | POA: Diagnosis present

## 2014-06-17 DIAGNOSIS — O034 Incomplete spontaneous abortion without complication: Secondary | ICD-10-CM

## 2014-06-17 DIAGNOSIS — O021 Missed abortion: Secondary | ICD-10-CM | POA: Diagnosis not present

## 2014-06-17 DIAGNOSIS — I4891 Unspecified atrial fibrillation: Secondary | ICD-10-CM | POA: Diagnosis not present

## 2014-06-17 NOTE — H&P (Signed)
History of Present Illness  General:  24yo G2P0010 @ 8684w1d who presents for suction D&C due to missed AB. Pt was seen in the office on 2/29, no FHT were appreciated.  Later that evening she was noted to have vaginal bleeding. The patient was seen in MAU where an ultrasound revealed findings of a missed AB (see US report below).  This pregnancy has been complicated by hyperemesis- pt was on Diclegis tid as well as zofran as needed.  Pt reports some cramping today, but no bleeding. No F/C/CP/SOB. No nausea or vomiting currently. Pt able tolerate po without problems. She wishes to proceed with surgical management.   Current Medications  Discontinued   Diclegis 10 mg / 10 mg Tablet 2 tablets at bedtime   Zofran ODT(Ondansetron HCl) 4 MG Tablet Dispersible 1 tablet on the tongue and allow to dissolve every 8 hrs   Fluconazole 150 MG Tablet 1 tablet Once a day   Diclegis 10 mg / 10 mg Tablet 2 tablets at bedtime   Medication List reviewed and reconciled with the patient    Past Medical History  AFIB- no medication currently   Surgical History  Denies Past Surgical History   Family History  Father: alive  Mother: alive, Fibrocystic breast tissue  1 brother(s) - healthy.   denies any GYN family cancer hx.   Social History  General:  Tobacco use  cigarettes: Never smoked Tobacco history last updated 03/25/2014 no Smoking.  Alcohol: yes, social.  no Recreational drug use.  Education: Nurse, [employer], Glass blower/designermajoring in Social Work.    Gyn History  Sexual activity currently sexually active.  Periods : every month.  LMP 03/11/2014.  Denies H/O Birth control none.  Denies H/O Last mammogram date.  Denies H/O STD Chlamydia 2011.    OB History  Pregnancy # 1 abortion, 2012.    Allergies  N.K.D.A.   Hospitalization/Major Diagnostic Procedure  Denies Past Hospitalization   Review of Systems  CONSTITUTIONAL:  no Chills. no Fever. no Skin rash.  HEENT:  Blurrred vision no.  CARDIOLOGY:   no Chest pain.  RESPIRATORY:  no Shortness of breath. no Cough.  GASTROENTEROLOGY:  no Abdominal pain. no Appetite change. no Change in bowel movements.  UROLOGY:  no Urinary frequency. no Urinary incontinence. no Urinary urgency.  FEMALE REPRODUCTIVE:  no Breast lumps or discharge. no Breast pain.  NEUROLOGY:  no Dizziness. no Headache.     Vital Signs  Wt 120, Wt change -5 lb, Ht 65, BMI 19.97, Pulse sitting 109, BP sitting 123/75.   Examination  General Examination: GENERAL APPEARANCE alert, oriented, NAD, pleasant.  SKIN: normal, no rash.  LUNGS: clear to auscultation bilaterally, no wheezes, rhonchi, rales.  HEART: no murmurs, regular rate and rhythm- no tachycardia noted.  ABDOMEN: soft and not tender, no rebound, no rigidity.  FEMALE GENITOURINARY: deferred.  EXTREMITIES: no edema present, normal range of motion.      LABS/Imagin:  US (3/29): Intrauterine pregnancy with irregular gestational sac and no fetal heart tones or cardiac activity. Findings are consistent with fetal demise/nonviable pregnancy.   CBC    Component Value Date/Time   WBC 8.5 06/06/2014 1639   RBC 4.19 06/06/2014 1639   HGB 11.3* 06/06/2014 1639   HCT 34.1* 06/06/2014 1639   PLT 241 06/06/2014 1639   MCV 81.4 06/06/2014 1639   MCH 27.0 06/06/2014 1639   MCHC 33.1 06/06/2014 1639   RDW 12.1 06/06/2014 1639   LYMPHSABS 1.1 06/06/2014 1639   MONOABS 0.4 06/06/2014  1639   EOSABS 0.0 06/06/2014 1639   BASOSABS 0.0 06/06/2014 1639     Assessments   1. Missed abortion - O02.1 (Primary)   Treatment  1. Missed abortion  Notes: Reviewed management options including expectant v. medical v. surgical management. Pt wishes to proceed with surgical management- reviewed benefits and risk of procedure including risk of bleeding, infection and Ashermann's syndrome. Pt aware and wishes to proceed with surgical intervention.    -T&S to be performed -Doxycycline IV to OR -SCDs to OR -LR @  125cc/hr  Myna Hidalgo, DO [phone] (pager) [phone] (office)

## 2014-06-17 NOTE — Discharge Instructions (Signed)
Incomplete Miscarriage A miscarriage is the sudden loss of an unborn baby (fetus) before the 20th week of pregnancy. In an incomplete miscarriage, parts of the fetus or placenta (afterbirth) remain in the body.  Having a miscarriage can be an emotional experience. Talk with your health care provider about any questions you may have about miscarrying, the grieving process, and your future pregnancy plans. CAUSES   Problems with the fetal chromosomes that make it impossible for the baby to develop normally. Problems with the baby's genes or chromosomes are most often the result of errors that occur by chance as the embryo divides and grows. The problems are not inherited from the parents.  Infection of the cervix or uterus.  Hormone problems.  Problems with the cervix, such as having an incompetent cervix. This is when the tissue in the cervix is not strong enough to hold the pregnancy.  Problems with the uterus, such as an abnormally shaped uterus, uterine fibroids, or congenital abnormalities.  Certain medical conditions.  Smoking, drinking alcohol, or taking illegal drugs.  Trauma. SYMPTOMS   Vaginal bleeding or spotting, with or without cramps or pain.  Pain or cramping in the abdomen or lower back.  Passing fluid, tissue, or blood clots from the vagina. DIAGNOSIS  Your health care provider will perform a physical exam. You may also have an ultrasound to confirm the miscarriage. Blood or urine tests may also be ordered. TREATMENT   Usually, a dilation and curettage (D&C) procedure is performed. During a D&C procedure, the cervix is widened (dilated) and any remaining fetal or placental tissue is gently removed from the uterus.  Antibiotic medicines are prescribed if there is an infection. Other medicines may be given to reduce the size of the uterus (contract) if there is a lot of bleeding.  If you have Rh negative blood and your baby was Rh positive, you will need a Rho (D)  immune globulin shot. This shot will protect any future baby from having Rh blood problems in future pregnancies.  You may be confined to bed rest. This means you should stay in bed and only get up to use the bathroom. HOME CARE INSTRUCTIONS   Rest as directed by your health care provider.  Restrict activity as directed by your health care provider. You may be allowed to continue light activity if curettage was not done but you require further treatment.  Keep track of the number of pads you use each day. Keep track of how soaked (saturated) they are. Record this information.  Do not  use tampons.  Do not douche or have sexual intercourse until approved by your health care provider.  Keep all follow-up appointments for reevaluation and continuing management.  Only take over-the-counter or prescription medicines for pain, fever, or discomfort as directed by your health care provider.  Take antibiotic medicine as directed by your health care provider. Make sure you finish it even if you start to feel better. SEEK IMMEDIATE MEDICAL CARE IF:   You experience severe cramps in your stomach, back, or abdomen.  You have an unexplained temperature (make sure to record these temperatures).  You pass large clots or tissue (save these for your health care provider to inspect).  Your bleeding increases.  You become light-headed, weak, or have fainting episodes. MAKE SURE YOU:   Understand these instructions.  Will watch your condition.  Will get help right away if you are not doing well or get worse. Document Released: 03/06/2005 Document Revised: 07/21/2013 Document Reviewed:   10/03/2012 Exit[employer] Patient Information 2015 [employer]. This information is not intended to replace advice given to you by your health care provider. Make sure you discuss any questions you have with your health care provider.  

## 2014-06-17 NOTE — MAU Note (Signed)
Dr. Charlotta Newtonzan in to see patient.

## 2014-06-18 ENCOUNTER — Encounter (HOSPITAL_COMMUNITY): Payer: Self-pay | Admitting: *Deleted

## 2014-06-18 ENCOUNTER — Ambulatory Visit (HOSPITAL_COMMUNITY): Payer: BLUE CROSS/BLUE SHIELD

## 2014-06-18 ENCOUNTER — Ambulatory Visit (HOSPITAL_COMMUNITY): Payer: BLUE CROSS/BLUE SHIELD | Admitting: Anesthesiology

## 2014-06-18 ENCOUNTER — Ambulatory Visit (HOSPITAL_COMMUNITY)
Admission: RE | Admit: 2014-06-18 | Discharge: 2014-06-18 | Disposition: A | Discharge status: Home / Self Care | Payer: BLUE CROSS/BLUE SHIELD | Source: Ambulatory Visit | Attending: Obstetrics & Gynecology | Admitting: Obstetrics & Gynecology

## 2014-06-18 ENCOUNTER — Encounter (HOSPITAL_COMMUNITY)
Admission: RE | Disposition: A | Discharge status: Home / Self Care | Payer: Self-pay | Source: Ambulatory Visit | Attending: Obstetrics & Gynecology

## 2014-06-18 DIAGNOSIS — O021 Missed abortion: Secondary | ICD-10-CM | POA: Insufficient documentation

## 2014-06-18 DIAGNOSIS — I4891 Unspecified atrial fibrillation: Secondary | ICD-10-CM | POA: Diagnosis not present

## 2014-06-18 DIAGNOSIS — Z3490 Encounter for supervision of normal pregnancy, unspecified, unspecified trimester: Secondary | ICD-10-CM

## 2014-06-18 DIAGNOSIS — Z79899 Other long term (current) drug therapy: Secondary | ICD-10-CM | POA: Diagnosis not present

## 2014-06-18 DIAGNOSIS — Z3A14 14 weeks gestation of pregnancy: Secondary | ICD-10-CM | POA: Insufficient documentation

## 2014-06-18 HISTORY — PX: DILATION AND EVACUATION: SHX1459

## 2014-06-18 LAB — CBC
HEMATOCRIT: 29.9 % — AB (ref 36.0–46.0)
Hemoglobin: 10 g/dL — ABNORMAL LOW (ref 12.0–15.0)
MCH: 27.2 pg (ref 26.0–34.0)
MCHC: 33.4 g/dL (ref 30.0–36.0)
MCV: 81.5 fL (ref 78.0–100.0)
Platelets: 294 10*3/uL (ref 150–400)
RBC: 3.67 MIL/uL — ABNORMAL LOW (ref 3.87–5.11)
RDW: 11.9 % (ref 11.5–15.5)
WBC: 7.4 10*3/uL (ref 4.0–10.5)

## 2014-06-18 LAB — ABO/RH: ABO/RH(D): O POS

## 2014-06-18 SURGERY — DILATION AND EVACUATION, UTERUS
Anesthesia: General | Site: Vagina

## 2014-06-18 MED ORDER — MIDAZOLAM HCL 5 MG/5ML IJ SOLN
INTRAMUSCULAR | Status: DC | PRN
  Administered 2014-06-18: 2 mg via INTRAVENOUS

## 2014-06-18 MED ORDER — ACETAMINOPHEN 160 MG/5ML PO SOLN
325.0000 mg | ORAL | Status: DC | PRN
  Administered 2014-06-18: 650 mg via ORAL

## 2014-06-18 MED ORDER — DEXAMETHASONE SODIUM PHOSPHATE 10 MG/ML IJ SOLN
INTRAMUSCULAR | Status: AC
  Filled 2014-06-18: qty 1

## 2014-06-18 MED ORDER — SUCCINYLCHOLINE CHLORIDE 20 MG/ML IJ SOLN
INTRAMUSCULAR | Status: AC
  Filled 2014-06-18: qty 1

## 2014-06-18 MED ORDER — LIDOCAINE-EPINEPHRINE 1 %-1:100000 IJ SOLN
INTRAMUSCULAR | Status: DC | PRN
  Administered 2014-06-18: 10 mL

## 2014-06-18 MED ORDER — GLYCOPYRROLATE 0.2 MG/ML IJ SOLN
INTRAMUSCULAR | Status: AC
  Filled 2014-06-18: qty 4

## 2014-06-18 MED ORDER — FENTANYL CITRATE 0.05 MG/ML IJ SOLN
INTRAMUSCULAR | Status: AC
  Filled 2014-06-18: qty 2

## 2014-06-18 MED ORDER — LIDOCAINE HCL (CARDIAC) 20 MG/ML IV SOLN
INTRAVENOUS | Status: AC
  Filled 2014-06-18: qty 5

## 2014-06-18 MED ORDER — ACETAMINOPHEN 325 MG PO TABS: 325.0000 mg | ORAL_TABLET | ORAL | Status: DC | PRN

## 2014-06-18 MED ORDER — SCOPOLAMINE 1 MG/3DAYS TD PT72: 1.0000 | MEDICATED_PATCH | Freq: Once | TRANSDERMAL | Status: DC

## 2014-06-18 MED ORDER — SUCCINYLCHOLINE CHLORIDE 20 MG/ML IJ SOLN
INTRAMUSCULAR | Status: DC | PRN
  Administered 2014-06-18: 100 mg via INTRAVENOUS

## 2014-06-18 MED ORDER — ONDANSETRON HCL 4 MG/2ML IJ SOLN
INTRAMUSCULAR | Status: DC | PRN
  Administered 2014-06-18: 4 mg via INTRAVENOUS

## 2014-06-18 MED ORDER — FENTANYL CITRATE 0.05 MG/ML IJ SOLN
INTRAMUSCULAR | Status: DC | PRN
  Administered 2014-06-18: 100 ug via INTRAVENOUS

## 2014-06-18 MED ORDER — ONDANSETRON HCL 4 MG/2ML IJ SOLN
INTRAMUSCULAR | Status: AC
  Filled 2014-06-18: qty 2

## 2014-06-18 MED ORDER — METHYLERGONOVINE MALEATE 0.2 MG PO TABS: 0.2000 mg | ORAL_TABLET | Freq: Three times a day (TID) | ORAL | Status: DC

## 2014-06-18 MED ORDER — LIDOCAINE HCL (CARDIAC) 20 MG/ML IV SOLN
INTRAVENOUS | Status: DC | PRN
  Administered 2014-06-18: 40 mg via INTRAVENOUS

## 2014-06-18 MED ORDER — PROPOFOL 10 MG/ML IV BOLUS
INTRAVENOUS | Status: DC | PRN
  Administered 2014-06-18: 200 mg via INTRAVENOUS

## 2014-06-18 MED ORDER — MEPERIDINE HCL 25 MG/ML IJ SOLN: 6.2500 mg | INTRAMUSCULAR | Status: DC | PRN

## 2014-06-18 MED ORDER — LIDOCAINE-EPINEPHRINE 1 %-1:100000 IJ SOLN
INTRAMUSCULAR | Status: AC
  Filled 2014-06-18: qty 1

## 2014-06-18 MED ORDER — PROMETHAZINE HCL 25 MG/ML IJ SOLN: 6.2500 mg | INTRAMUSCULAR | Status: DC | PRN

## 2014-06-18 MED ORDER — DOXYCYCLINE HYCLATE 100 MG IV SOLR
100.0000 mg | Freq: Once | INTRAVENOUS | Status: AC
  Administered 2014-06-18: 100 mg via INTRAVENOUS
  Filled 2014-06-18: qty 100

## 2014-06-18 MED ORDER — MIDAZOLAM HCL 2 MG/2ML IJ SOLN
INTRAMUSCULAR | Status: AC
  Filled 2014-06-18: qty 2

## 2014-06-18 MED ORDER — KETOROLAC TROMETHAMINE 30 MG/ML IJ SOLN: 30.0000 mg | Freq: Once | INTRAMUSCULAR | Status: DC | PRN

## 2014-06-18 MED ORDER — MIDAZOLAM HCL 2 MG/2ML IJ SOLN: 0.5000 mg | Freq: Once | INTRAMUSCULAR | Status: DC | PRN

## 2014-06-18 MED ORDER — ACETAMINOPHEN 160 MG/5ML PO SOLN
ORAL | Status: AC
  Administered 2014-06-18: 650 mg via ORAL
  Filled 2014-06-18: qty 20.3

## 2014-06-18 MED ORDER — PROPOFOL 10 MG/ML IV EMUL
INTRAVENOUS | Status: AC
Start: 2014-06-18 — End: 2014-06-18
  Filled 2014-06-18: qty 50

## 2014-06-18 MED ORDER — KETOROLAC TROMETHAMINE 30 MG/ML IJ SOLN
INTRAMUSCULAR | Status: AC
  Filled 2014-06-18: qty 1

## 2014-06-18 MED ORDER — DEXAMETHASONE SODIUM PHOSPHATE 10 MG/ML IJ SOLN
INTRAMUSCULAR | Status: DC | PRN
  Administered 2014-06-18: 10 mg via INTRAVENOUS

## 2014-06-18 MED ORDER — FENTANYL CITRATE 0.05 MG/ML IJ SOLN
25.0000 ug | INTRAMUSCULAR | Status: DC | PRN
  Administered 2014-06-18: 25 ug via INTRAVENOUS

## 2014-06-18 MED ORDER — LACTATED RINGERS IV SOLN
INTRAVENOUS | Status: DC
  Administered 2014-06-18 (×2): via INTRAVENOUS

## 2014-06-18 SURGICAL SUPPLY — 19 items
CATH ROBINSON RED A/P 16FR (CATHETERS) ×3 IMPLANT
CLOTH BEACON ORANGE TIMEOUT ST (SAFETY) ×3 IMPLANT
DECANTER SPIKE VIAL GLASS SM (MISCELLANEOUS) ×3 IMPLANT
GLOVE BIO SURGEON STRL SZ 6.5 (GLOVE) ×4 IMPLANT
GLOVE BIO SURGEONS STRL SZ 6.5 (GLOVE) ×2
GLOVE BIOGEL PI IND STRL 6.5 (GLOVE) ×1 IMPLANT
GLOVE BIOGEL PI INDICATOR 6.5 (GLOVE) ×2
GOWN STRL REUS W/TWL LRG LVL3 (GOWN DISPOSABLE) ×6 IMPLANT
KIT BERKELEY 1ST TRIMESTER 3/8 (MISCELLANEOUS) ×3 IMPLANT
NS IRRIG 1000ML POUR BTL (IV SOLUTION) ×3 IMPLANT
PACK VAGINAL MINOR WOMEN LF (CUSTOM PROCEDURE TRAY) ×3 IMPLANT
PAD OB MATERNITY 4.3X12.25 (PERSONAL CARE ITEMS) ×3 IMPLANT
PAD PREP 24X48 CUFFED NSTRL (MISCELLANEOUS) ×3 IMPLANT
SET BERKELEY SUCTION TUBING (SUCTIONS) ×3 IMPLANT
TOWEL OR 17X24 6PK STRL BLUE (TOWEL DISPOSABLE) ×6 IMPLANT
VACURETTE 10 RIGID CVD (CANNULA) IMPLANT
VACURETTE 7MM CVD STRL WRAP (CANNULA) IMPLANT
VACURETTE 8 RIGID CVD (CANNULA) IMPLANT
VACURETTE 9 RIGID CVD (CANNULA) IMPLANT

## 2014-06-18 NOTE — Interval H&P Note (Signed)
History and Physical Interval Note:  06/18/2014 12:20 PM  Nicole Flynn  has presented today for surgery, with the diagnosis of O02.1 Missed Abortion  The various methods of treatment have been discussed with the patient and family. After consideration of risks, benefits and other options for treatment, the patient has consented to  Procedure(s): DILATATION AND EVACUATION (N/A) as a surgical intervention .  The patient's history has been reviewed, patient examined, no change in status, stable for surgery.  I have reviewed the patient's chart and labs.  Questions were answered to the patient's satisfaction.     Myna HidalgoZAN, Aleighya Mcanelly, M

## 2014-06-18 NOTE — Op Note (Signed)
reoperative diagnosis: Missed AB  Postoperative diagnosis: Same  Anesthesia: general  Procedure: Dilatation and evacuation with ultrasound guidance  Surgeon: Dr. Myna HidalgoJennifer Ha Shannahan  Estimated blood loss: 100cc UOP: 50cc IVF: 1000cc  Procedure:  Patient was brought to the operating room and placed in low lithotomy position.  She was  prepped and draped in a sterile fashion and the bladder was emptied with an in and out red rubber catheter.  Pelvic exam reveals a 11wk sized uterus, no adnexal abnormalities noted.  A sterile speculum was inserted in the vagina.  2cc of local was placed at the 12 o'clock position and the anterior lip of the cervix was grasped with a tenaculum forcep. The uterus was then sounded at 11 cm. The cervix was easily dilated using Hank dilators to allow for passage of the #10 curved cannula.  Suction and evacuation of products of conception was performed.  Due to large products of conception, ultrasound was also used to ensure adequate evacuation of the cavity.  Difficulty.  Sharp curettage of the uterine cavity was also used to confirm complete evacuation.  Instruments were removed and hemostasis was noted. Instrument and sponge count is complete x2.  The procedure is very well tolerated by the patient is taken to recovery room in a well and stable condition.  Specimen: Products of conception sent to pathology

## 2014-06-18 NOTE — Anesthesia Postprocedure Evaluation (Signed)
  Anesthesia Post Note  Patient: Nicole Flynn  Procedure(s) Performed: Procedure(s) (LRB): DILATATION AND EVACUATION WITH ULTRA SOUND (N/A)  Anesthesia type: GA  Patient location: PACU  Post pain: Pain level controlled  Post assessment: Post-op Vital signs reviewed  Last Vitals:  Filed Vitals:   06/18/14 1430  BP: 114/66  Pulse: 78  Temp:   Resp: 18    Post vital signs: Reviewed  Level of consciousness: sedated  Complications: No apparent anesthesia complications

## 2014-06-18 NOTE — Transfer of Care (Signed)
Immediate Anesthesia Transfer of Care Note  Patient: Nicole Flynn  Procedure(s) Performed: Procedure(s): DILATATION AND EVACUATION WITH ULTRA SOUND (N/A)  Patient Location: PACU  Anesthesia Type:General  Level of Consciousness: sedated  Airway & Oxygen Therapy: Patient Spontanous Breathing and Patient connected to nasal cannula oxygen  Post-op Assessment: Report given to RN and Post -op Vital signs reviewed and stable  Post vital signs: stable  Last Vitals:  Filed Vitals:   06/18/14 1115  BP: 113/73  Temp: 37 C  Resp: 20    Complications: No apparent anesthesia complications

## 2014-06-18 NOTE — H&P (View-Only) (Signed)
      HPI: FU atrial fibrillation. Laboratories in August of 2014 showed a TSH of 0.596. Patient seen in the emergency room 8/14 with complaints of diarrhea. She also described dizziness and chest tightness as well as palpitations. Patient noted to be in atrial fibrillation. Patient given a prescription for metoprolol. Echocardiogram August 2014 showed normal LV function. Chest CT November 2015 showed no pulmonary embolus. Seen in the emergency room November 2015 with chest pain and afib. Troponin normal. Hemoglobin and potassium normal. Since last seen, she continues to have occasional nonexertional chest pain. No dyspnea. Palpitations have improved. Patient is now pregnant.  Current Outpatient Prescriptions  Medication Sig Dispense Refill  . acetaminophen (TYLENOL) 325 MG tablet Take 325-650 mg by mouth every 6 (six) hours as needed (pain).    . Doxylamine-Pyridoxine 10-10 MG TBEC Take by mouth. Take 1 tablet by mouth in the morning, 1 tablet in the afternoon, and 2 tablets in the evening.    . metoprolol succinate (TOPROL-XL) 25 MG 24 hr tablet Take 1 tablet (25 mg total) by mouth daily. 90 tablet 3  . Prenatal Vit-Fe Fumarate-FA (PRENATAL MULTIVITAMIN) TABS tablet Take 1 tablet by mouth daily at 12 noon.    . promethazine (PHENERGAN) 25 MG tablet Take 1 tablet (25 mg total) by mouth every 6 (six) hours as needed for nausea or vomiting. 10 tablet 0   No current facility-administered medications for this visit.     Past Medical History  Diagnosis Date  . Anxiety   . A-fib     History reviewed. No pertinent past surgical history.  History   Social History  . Marital Status: Single    Spouse Name: N/A  . Number of Children: N/A  . Years of Education: N/A   Occupational History  . Not on file.   Social History Main Topics  . Smoking status: Never Smoker   . Smokeless tobacco: Not on file  . Alcohol Use: Yes     Comment: Occasional  . Drug Use: Yes    Special: Marijuana  .  Sexual Activity: Not on file   Other Topics Concern  . Not on file   Social History Narrative    ROS: no fevers or chills, productive cough, hemoptysis, dysphasia, odynophagia, melena, hematochezia, dysuria, hematuria, rash, seizure activity, orthopnea, PND, pedal edema, claudication. Remaining systems are negative.  Physical Exam: Well-developed well-nourished in no acute distress.  Skin is warm and dry.  HEENT is normal.  Neck is supple.  Chest is clear to auscultation with normal expansion.  Cardiovascular exam is regular rate and rhythm.  Abdominal exam nontender or distended. No masses palpated. Extremities show no edema. neuro grossly intact      

## 2014-06-18 NOTE — Discharge Instructions (Addendum)
HOME INSTRUCTIONS  Please note any unusual or excessive bleeding, pain, swelling. Mild dizziness or drowsiness are normal for about 24 hours after surgery.   Shower when comfortable  Restrictions: No driving for 24 hours or while taking pain medications.  Activity:  No heavy lifting (> 10 lbs), nothing in vagina (no tampons, douching, or intercourse) x 2 weeks; no tub baths for 2 weeks Vaginal bleeding is expected, if you are soaking through pads every hour, please call the office immediately   Diet:  You may eat whatever you want.  Do not eat large meals.  Eat small frequent meals throughout the day.  Continue to drink a good amount of water at least 6-8 glasses of water per day, hydration is very important for the healing process.  Pain Management: Take Motrin or Tylenol as needed for pain.  Always take prescription pain medication with food, it may cause constipation, increase fluids and fiber and you may want to take an over-the-counter stool softener like Colace as needed up to 2x a day.    Alcohol -- Avoid for 24 hours and while taking pain medications.  Nausea: Take sips of ginger ale or soda  Fever -- Call physician if temperature over 101 degrees  Follow up:  If you do not already have a follow up appointment scheduled, please call the office at [phone].  If you experience fever (a temperature greater than 100.4), pain unrelieved by pain medication, shortness of breath, swelling of a single leg, or any other symptoms which are concerning to you please the office immediately.

## 2014-06-18 NOTE — Anesthesia Preprocedure Evaluation (Addendum)
Anesthesia Evaluation  Patient identified by MRN, date of birth, ID band Patient awake    Reviewed: Allergy & Precautions, H&P , Patient's Chart, lab work & pertinent test results, reviewed documented beta blocker date and time   History of Anesthesia Complications Negative for: history of anesthetic complications  Airway Mallampati: II  TM Distance: >3 FB Neck ROM: full    Dental   Pulmonary  breath sounds clear to auscultation        Cardiovascular Exercise Tolerance: Good Atrial Fibrillation Rhythm:regular Rate:Normal  afib in past   Neuro/Psych PSYCHIATRIC DISORDERS negative psych ROS   GI/Hepatic   Endo/Other    Renal/GU      Musculoskeletal   Abdominal   Peds  Hematology   Anesthesia Other Findings   Reproductive/Obstetrics                             Anesthesia Physical Anesthesia Plan  ASA: II  Anesthesia Plan: General ETT   Post-op Pain Management:    Induction: Rapid sequence, Cricoid pressure planned and Intravenous  Airway Management Planned: Oral ETT  Additional Equipment:   Intra-op Plan:   Post-operative Plan:   Informed Consent: I have reviewed the patients History and Physical, chart, labs and discussed the procedure including the risks, benefits and alternatives for the proposed anesthesia with the patient or authorized representative who has indicated his/her understanding and acceptance.   Dental Advisory Given  Plan Discussed with: CRNA, Surgeon and Anesthesiologist  Anesthesia Plan Comments:        Anesthesia Quick Evaluation

## 2014-06-19 ENCOUNTER — Encounter (HOSPITAL_COMMUNITY): Payer: Self-pay | Admitting: Obstetrics & Gynecology

## 2014-06-22 ENCOUNTER — Telehealth: Payer: Self-pay | Admitting: Cardiology

## 2014-06-22 NOTE — Telephone Encounter (Signed)
Left message for patient to call back so we can schedule ECHO that was ordered by Dr. Jens Somrenshaw

## 2014-06-22 NOTE — Telephone Encounter (Signed)
Left another message for patient to call.

## 2014-06-29 ENCOUNTER — Inpatient Hospital Stay (HOSPITAL_COMMUNITY): Admission: RE | Admit: 2014-06-29 | Payer: BLUE CROSS/BLUE SHIELD | Source: Ambulatory Visit

## 2014-07-26 ENCOUNTER — Encounter (HOSPITAL_COMMUNITY): Payer: Self-pay | Admitting: *Deleted

## 2014-07-26 ENCOUNTER — Emergency Department (HOSPITAL_COMMUNITY)
Admission: EM | Admit: 2014-07-26 | Discharge: 2014-07-26 | Disposition: A | Discharge status: Home / Self Care | Payer: BLUE CROSS/BLUE SHIELD | Attending: Emergency Medicine | Admitting: Emergency Medicine

## 2014-07-26 DIAGNOSIS — R110 Nausea: Secondary | ICD-10-CM

## 2014-07-26 DIAGNOSIS — Z8659 Personal history of other mental and behavioral disorders: Secondary | ICD-10-CM | POA: Diagnosis not present

## 2014-07-26 DIAGNOSIS — R42 Dizziness and giddiness: Secondary | ICD-10-CM

## 2014-07-26 DIAGNOSIS — O039 Complete or unspecified spontaneous abortion without complication: Secondary | ICD-10-CM | POA: Diagnosis not present

## 2014-07-26 DIAGNOSIS — R5383 Other fatigue: Secondary | ICD-10-CM | POA: Insufficient documentation

## 2014-07-26 DIAGNOSIS — Z3A00 Weeks of gestation of pregnancy not specified: Secondary | ICD-10-CM | POA: Insufficient documentation

## 2014-07-26 DIAGNOSIS — Z8679 Personal history of other diseases of the circulatory system: Secondary | ICD-10-CM | POA: Insufficient documentation

## 2014-07-26 DIAGNOSIS — R51 Headache: Secondary | ICD-10-CM | POA: Diagnosis present

## 2014-07-26 LAB — COMPREHENSIVE METABOLIC PANEL
ALBUMIN: 3.6 g/dL (ref 3.5–5.0)
ALT: 9 U/L — ABNORMAL LOW (ref 14–54)
ANION GAP: 5 (ref 5–15)
AST: 20 U/L (ref 15–41)
Alkaline Phosphatase: 46 U/L (ref 38–126)
BUN: 10 mg/dL (ref 6–20)
CHLORIDE: 110 mmol/L (ref 101–111)
CO2: 23 mmol/L (ref 22–32)
CREATININE: 0.81 mg/dL (ref 0.44–1.00)
Calcium: 8.5 mg/dL — ABNORMAL LOW (ref 8.9–10.3)
GFR calc Af Amer: >60 mL/min (ref >60)
GFR calc non Af Amer: >60 mL/min (ref >60)
Glucose, Bld: 124 mg/dL — ABNORMAL HIGH (ref 70–99)
Potassium: 3.6 mmol/L (ref 3.5–5.1)
Sodium: 138 mmol/L (ref 135–145)
Total Bilirubin: 0.8 mg/dL (ref 0.3–1.2)
Total Protein: 6.5 g/dL (ref 6.5–8.1)

## 2014-07-26 LAB — CBC WITH DIFFERENTIAL/PLATELET
Basophils Absolute: 0 10*3/uL (ref 0.0–0.1)
Basophils Relative: 0 % (ref 0–1)
EOS ABS: 0 10*3/uL (ref 0.0–0.7)
EOS PCT: 0 % (ref 0–5)
HCT: 37.3 % (ref 36.0–46.0)
Hemoglobin: 11.7 g/dL — ABNORMAL LOW (ref 12.0–15.0)
LYMPHS ABS: 1.3 10*3/uL (ref 0.7–4.0)
LYMPHS PCT: 14 % (ref 12–46)
MCH: 27.4 pg (ref 26.0–34.0)
MCHC: 31.4 g/dL (ref 30.0–36.0)
MCV: 87.4 fL (ref 78.0–100.0)
Monocytes Absolute: 0.4 10*3/uL (ref 0.1–1.0)
Monocytes Relative: 4 % (ref 3–12)
Neutro Abs: 7.3 10*3/uL (ref 1.7–7.7)
Neutrophils Relative %: 82 % — ABNORMAL HIGH (ref 43–77)
Platelets: 277 10*3/uL (ref 150–400)
RBC: 4.27 MIL/uL (ref 3.87–5.11)
RDW: 13.2 % (ref 11.5–15.5)
WBC: 8.9 10*3/uL (ref 4.0–10.5)

## 2014-07-26 LAB — LIPASE, BLOOD: LIPASE: 28 U/L (ref 22–51)

## 2014-07-26 LAB — URINALYSIS, ROUTINE W REFLEX MICROSCOPIC
BILIRUBIN URINE: NEGATIVE
Glucose, UA: NEGATIVE mg/dL
HGB URINE DIPSTICK: NEGATIVE
Ketones, ur: NEGATIVE mg/dL
Leukocytes, UA: NEGATIVE
Nitrite: NEGATIVE
Protein, ur: NEGATIVE mg/dL
SPECIFIC GRAVITY, URINE: 1.011 (ref 1.005–1.030)
Urobilinogen, UA: 0.2 mg/dL (ref 0.0–1.0)
pH: 6.5 (ref 5.0–8.0)

## 2014-07-26 LAB — WET PREP, GENITAL
Clue Cells Wet Prep HPF POC: NONE SEEN
Trich, Wet Prep: NONE SEEN
WBC WET PREP: NONE SEEN
YEAST WET PREP: NONE SEEN

## 2014-07-26 LAB — I-STAT BETA HCG BLOOD, ED (MC, WL, AP ONLY): I-stat hCG, quantitative: 21.8 m[IU]/mL — ABNORMAL HIGH (ref <5)

## 2014-07-26 MED ORDER — SODIUM CHLORIDE 0.9 % IV BOLUS (SEPSIS)
1000.0000 mL | Freq: Once | INTRAVENOUS | Status: AC
  Administered 2014-07-26: 1000 mL via INTRAVENOUS

## 2014-07-26 MED ORDER — MECLIZINE HCL 25 MG PO TABS
50.0000 mg | ORAL_TABLET | Freq: Once | ORAL | Status: DC
  Filled 2014-07-26: qty 2

## 2014-07-26 NOTE — Discharge Instructions (Signed)
Possible Miscarriage A miscarriage is the sudden loss of an unborn baby (fetus) before the 20th week of pregnancy. Most miscarriages happen in the first 3 months of pregnancy. Sometimes, it happens before a woman even knows she is pregnant. A miscarriage is also called a "spontaneous miscarriage" or "early pregnancy loss." Having a miscarriage can be an emotional experience. Talk with your caregiver about any questions you may have about miscarrying, the grieving process, and your future pregnancy plans. CAUSES   Problems with the fetal chromosomes that make it impossible for the baby to develop normally. Problems with the baby's genes or chromosomes are most often the result of errors that occur, by chance, as the embryo divides and grows. The problems are not inherited from the parents.  Infection of the cervix or uterus.   Hormone problems.   Problems with the cervix, such as having an incompetent cervix. This is when the tissue in the cervix is not strong enough to hold the pregnancy.   Problems with the uterus, such as an abnormally shaped uterus, uterine fibroids, or congenital abnormalities.   Certain medical conditions.   Smoking, drinking alcohol, or taking illegal drugs.   Trauma.  Often, the cause of a miscarriage is unknown.  SYMPTOMS   Vaginal bleeding or spotting, with or without cramps or pain.  Pain or cramping in the abdomen or lower back.  Passing fluid, tissue, or blood clots from the vagina. DIAGNOSIS  Your caregiver will perform a physical exam. You may also have an ultrasound to confirm the miscarriage. Blood or urine tests may also be ordered. TREATMENT   Sometimes, treatment is not necessary if you naturally pass all the fetal tissue that was in the uterus. If some of the fetus or placenta remains in the body (incomplete miscarriage), tissue left behind may become infected and must be removed. Usually, a dilation and curettage (D and C) procedure is  performed. During a D and C procedure, the cervix is widened (dilated) and any remaining fetal or placental tissue is gently removed from the uterus.  Antibiotic medicines are prescribed if there is an infection. Other medicines may be given to reduce the size of the uterus (contract) if there is a lot of bleeding.  If you have Rh negative blood and your baby was Rh positive, you will need a Rh immunoglobulin shot. This shot will protect any future baby from having Rh blood problems in future pregnancies. HOME CARE INSTRUCTIONS   Your caregiver may order bed rest or may allow you to continue light activity. Resume activity as directed by your caregiver.  Have someone help with home and family responsibilities during this time.   Keep track of the number of sanitary pads you use each day and how soaked (saturated) they are. Write down this information.   Do not use tampons. Do not douche or have sexual intercourse until approved by your caregiver.   Only take over-the-counter or prescription medicines for pain or discomfort as directed by your caregiver.   Do not take aspirin. Aspirin can cause bleeding.   Keep all follow-up appointments with your caregiver.   If you or your partner have problems with grieving, talk to your caregiver or seek counseling to help cope with the pregnancy loss. Allow enough time to grieve before trying to get pregnant again.  SEEK IMMEDIATE MEDICAL CARE IF:   You have severe cramps or pain in your back or abdomen.  You have a fever.  You pass large blood clots (  walnut-sized or larger) ortissue from your vagina. Save any tissue for your caregiver to inspect.   Your bleeding increases.   You have a thick, bad-smelling vaginal discharge.  You become lightheaded, weak, or you faint.   You have chills.  MAKE SURE YOU:  Understand these instructions.  Will watch your condition.  Will get help right away if you are not doing well or get  worse. Document Released: 08/30/2000 Document Revised: 07/01/2012 Document Reviewed: 04/25/2011 [facility] CenterExitCare Patient Information 2015 [employer]Exit[employer], [place]. This information is not intended to replace advice given to you by your health care provider. Make sure you discuss any questions you have with your health care provider.

## 2014-07-26 NOTE — ED Notes (Signed)
Per EMS pt was at work and started feeling "bad", reports nausea, headache, sts recent miscarriage. PIV started en route, pt given 4mg  IV zofran. Per EMS pt was orthostatic on scene.

## 2014-07-26 NOTE — ED Notes (Signed)
Bed: [id number] Expected date:  Expected time:  Means of arrival:  Comments: Not feeling well

## 2014-07-26 NOTE — ED Provider Notes (Signed)
CSN: [id number]     Arrival date & time 07/26/14  1637 History   First MD Initiated Contact with Patient 07/26/14 1649     Chief Complaint  Patient presents with  . Nausea  . Dehydration  . Headache   Nicole Flynn is a 24 y.o. female with a history of anxiety and G2P0020 who presents to the ED complaining of fatigue for the past two days associated with feeling lightheaded and nauseated today. Patient reports feeling more fatigued over the past 2 days and took a pregnancy test 2 days ago because she thought she might be pregnant. She reports the pregnancy test showed a faint positive line two days ago. She reports starting her menstrual cycle two days ago and now just has some light spotting.  The patient reports that she had a miscarriage 5 weeks ago. She reports that she is sexually active and not using protection. She is not on birth control. She reports today after working out she had something to eat and then had some nausea but did not vomit. She reports one episode of diarrhea today. She reports having some low abdominal cramping the past 2 days but none currently or today. She reports she sees an OB[facility]N [facility]. The patient denies fevers, chills, urinary symptoms, loss of consciousness, falling, chest pain, shortness of breath, room spinning, hematemesis, hematochezia, headache, loss of consciousness, vaginal discharge or current abdominal pain. The patent is O positive from recent blood work from her D&C 5 weeks ago.   (Consider location/radiation/quality/duration/timing/severity/associated sxs/prior Treatment) HPI  Past Medical History  Diagnosis Date  . Anxiety   . A-fib    Past Surgical History  Procedure Laterality Date  . Elective abortion    . Dilation and evacuation N/A 06/18/2014    Procedure: DILATATION AND EVACUATION WITH ULTRA SOUND;  Surgeon: Myna HidalgoJennifer Ozan, DO;  Location: [facility];  Service: Gynecology;  Laterality: N/A;   Family History  Problem Relation Age of Onset   . Heart disease Mother     Atrial fibrillation   History  Substance Use Topics  . Smoking status: Never Smoker   . Smokeless tobacco: Never Used  . Alcohol Use: No     Comment: Occasional   OB History    Gravida Para Term Preterm AB TAB SAB Ectopic Multiple Living   2    1 1     0     Review of Systems  Constitutional: Positive for fatigue. Negative for fever and chills.  HENT: Negative for congestion and sore throat.   Eyes: Negative for visual disturbance.  Respiratory: Negative for cough, shortness of breath and wheezing.   Cardiovascular: Negative for chest pain and palpitations.  Gastrointestinal: Positive for nausea and abdominal pain. Negative for vomiting, diarrhea and blood in stool.  Genitourinary: Positive for vaginal bleeding. Negative for dysuria, urgency, frequency, hematuria, vaginal discharge, difficulty urinating and genital sores.  Musculoskeletal: Negative for back pain and neck pain.  Skin: Negative for rash.  Neurological: Positive for light-headedness. Negative for dizziness, syncope, weakness, numbness and headaches.      Allergies  Review of patient's allergies indicates no known allergies.  Home Medications   Prior to Admission medications   Medication Sig Start Date End Date Taking? Authorizing Provider  acetaminophen (TYLENOL) 500 MG tablet Take 1,000 mg by mouth every 6 (six) hours as needed for mild pain, moderate pain, fever or headache.   Yes Historical Provider, MD   BP 108/66 mmHg  Pulse 70  Temp(Src) 98.8  F (37.1 C) (Oral)  Resp 18  SpO2 100%  LMP 07/24/2014 Physical Exam  Constitutional: She is oriented to person, place, and time. She appears well-developed and well-nourished. No distress.  Nontoxic appearing.  HENT:  Head: Normocephalic and atraumatic.  Right Ear: External ear normal.  Left Ear: External ear normal.  Mouth/Throat: Oropharynx is clear and moist. No oropharyngeal exudate.  Eyes: Conjunctivae are normal. Pupils  are equal, round, and reactive to light. Right eye exhibits no discharge. Left eye exhibits no discharge.  Neck: Normal range of motion. Neck supple. No JVD present. No tracheal deviation present.  Cardiovascular: Normal rate, regular rhythm, normal heart sounds and intact distal pulses.  Exam reveals no gallop and no friction rub.   No murmur heard. Bilateral radial pulses are intact.  Pulmonary/Chest: Effort normal and breath sounds normal. No respiratory distress. She has no wheezes. She has no rales.  Abdominal: Soft. Bowel sounds are normal. She exhibits no distension and no mass. There is no tenderness. There is no rebound and no guarding.  Abdomen is soft and nontender to palpation. Bowel sounds are present. Negative Rovsing sign. No rebound tenderness. Negative psoas and obturator sign.  Genitourinary:  Pelvic exam preformed by me with female nurse tech as chaperone. No external lesions or abrasions. The patient has a small amount of blood in her vaginal vault. Her cervix is closed. No cervical motion tenderness. No adnexal tenderness or fullness. No masses.   Musculoskeletal: She exhibits no edema.  Lymphadenopathy:    She has no cervical adenopathy.  Neurological: She is alert and oriented to person, place, and time. Coordination normal.  Skin: Skin is warm and dry. No rash noted. She is not diaphoretic. No erythema. No pallor.  Psychiatric: She has a normal mood and affect. Her behavior is normal.  Nursing note and vitals reviewed.   ED Course  Procedures (including critical care time) Labs Review Labs Reviewed  COMPREHENSIVE METABOLIC PANEL - Abnormal; Notable for the following:    Glucose, Bld 124 (*)    Calcium 8.5 (*)    ALT 9 (*)    All other components within normal limits  CBC WITH DIFFERENTIAL/PLATELET - Abnormal; Notable for the following:    Hemoglobin 11.7 (*)    Neutrophils Relative % 82 (*)    All other components within normal limits  I-STAT BETA HCG BLOOD, ED  ([facility], [facility], [facility] ONLY) - Abnormal; Notable for the following:    I-stat hCG, quantitative 21.8 (*)    All other components within normal limits  WET PREP, GENITAL  LIPASE, BLOOD  URINALYSIS, ROUTINE W REFLEX MICROSCOPIC  GC/CHLAMYDIA PROBE AMP ([place])    Imaging Review No results found.   EKG Interpretation None      Filed Vitals:   07/26/14 1644 07/26/14 1800 07/26/14 1919 07/26/14 2054  BP: 129/77 110/65 110/66 108/66  Pulse: 71 75 87 70  Temp: 98.8 F (37.1 C)  98.7 F (37.1 C) 98.8 F (37.1 C)  TempSrc: Oral  Oral Oral  Resp: 18  18 18   SpO2: 100% 100% 100% 100%     MDM   Meds given in ED:  Medications  sodium chloride 0.9 % bolus 1,000 mL (0 mLs Intravenous Stopped 07/26/14 2013)    New Prescriptions   No medications on file    Final diagnoses:  Miscarriage  Nausea  Intermittent lightheadedness   This is a 24 y.o. female with a history of anxiety and G2P0020 who presents to the ED complaining  of fatigue for the past two days associated with feeling lightheaded and nauseated today. Patient reports feeling more fatigued over the past 2 days and took a pregnancy test 2 days ago because she thought she might be pregnant. She reports the pregnancy test showed a faint positive line two days ago. She reports starting her menstrual cycle two days ago and now just has some light spotting.  The patient reports having some abdominal cramping 2 days ago but denies any pain today or currently. On exam patient is afebrile nontoxic appearing. Her abdomen is soft and nontender to palpation. On pelvic exam the patient has a small amount of blood in her vaginal vault. There is no adnexal tenderness or fullness. She has no cervical motion tenderness. There are no masses appreciated. The patient's urinalysis is negative for infection. Her CMP is unremarkable. She has a normal lipase. Her CBC shows a hemoglobin of 11.7 which is an improvement from her previous blood work. Her i-STAT hCG  is 21.8. I suspect this is a falling hCG from a miscarriage. She did have a D&C for missed abortion 5 weeks ago. There is a possibility for retained products of conception. However the patient has no tenderness on examination. She is hemodynamically stable. The patient has an appointment for follow-up with her OB/GYN tomorrow. She is O positive from blood work 5 weeks ago and does not need rhogam.  At reevaluation the patient reports feeling much better after fluid bolus. She has tolerated by mouth in the ED. Patient is ambulated to the bathroom without difficulty or assistance. She denies feeling lightheaded or dizzy upon standing. I consulted with OB/GYN Dr. Debroah Loop who advises to have her keep her appointment for follow up with OB for tomorrow. I advised the patient of this plan and she agrees. She reports feeling comfortable going home. The patient feels well. She denies any nausea or pain currently. I advised the patient to follow-up with OB/GYN tomorrow at her appointment. Strict return precautions provided. I advised the patient to return to the emergency department with new or worsening symptoms or new concerns. The patient verbalized understanding and agreement with plan.    This patient was discussed with Dr. Ethelda Chick who agrees with assessment and plan.    Everlene Farrier, PA-C 07/26/14 2110  Doug Sou, MD 07/26/14 2329

## 2014-07-27 LAB — GC/CHLAMYDIA PROBE AMP (~~LOC~~) NOT AT ARMC
CHLAMYDIA, DNA PROBE: NEGATIVE
Neisseria Gonorrhea: NEGATIVE

## 2016-02-23 ENCOUNTER — Telehealth: Payer: Self-pay | Admitting: Cardiology

## 2016-02-23 NOTE — Telephone Encounter (Signed)
Pt calling regarding having a new job with [employer] where she now lives in [place]. She is having to do heavy lifting and needs a letter stating that she has limitations. I told her we haven't seen her since 06-16-14 and I didn't think we could do that, or she would like a referral if Dr. Jens Somrenshaw know of anyone in that area--pls advise [phone]

## 2016-02-24 NOTE — Telephone Encounter (Signed)
Spoke with pt, she really has no restrictions from a heart standpoint. Explained if something had changed she would need to make an appointment to be seen. She wants to see someone where she is currently living. Dr Jens Somcrenshaw does not know anyone in the [place] area. Patient will let us knwo when she finds a new doctor so we can send our records to them.

## 2016-06-29 IMAGING — CT CT ANGIO CHEST
3 of 10 series · 19 of 46 positions shown · IV contrast (OMNI)
Comparison: 10/29/2012

CLINICAL DATA: CHEST PAIN ON THE LEFT STARTING LAST WEEK. SHORTNESS
OF BREATH.

EXAM:
CT ANGIOGRAPHY CHEST WITH CONTRAST
TECHNIQUE: Multidetector CT imaging of the chest was performed using the
standard protocol during bolus administration of intravenous
contrast. Multiplanar CT image reconstructions and MIPs were
obtained to evaluate the vascular anatomy.
CONTRAST:  100mL OMNIPAQUE IOHEXOL 350 MG/ML SOLN

[Series 6: thins · axial · 0.53mm/px · z∈[+888,+1112]mm · 16 of 254 slices shown]
[im 15/254  lung]
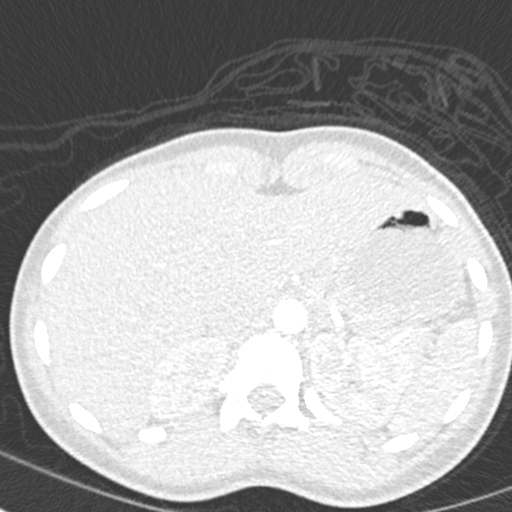
[im 30/254  soft-tissue]
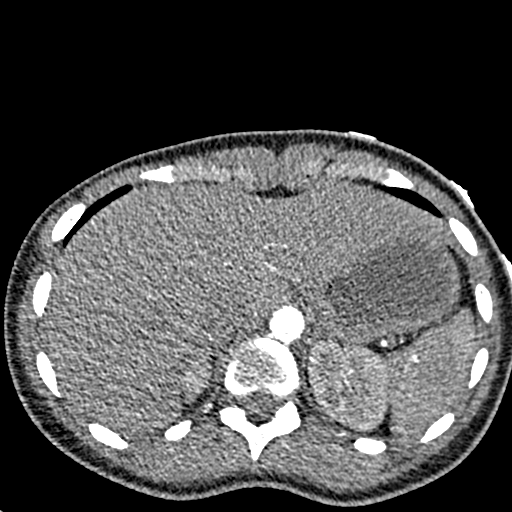
[im 45/254  lung]
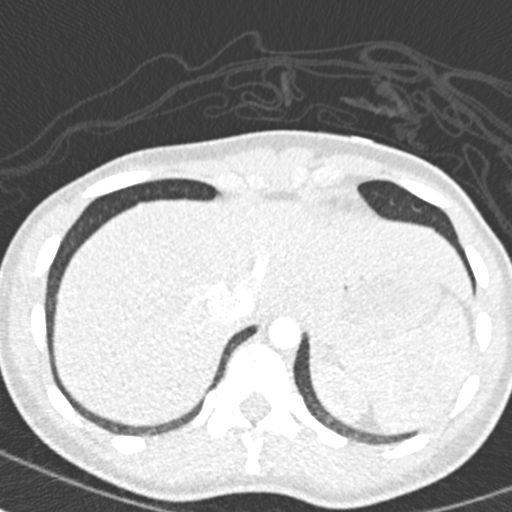
[im 60/254  soft-tissue]
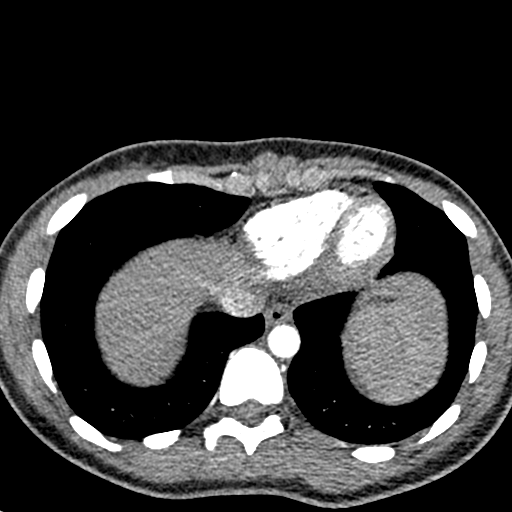
[im 75/254  lung]
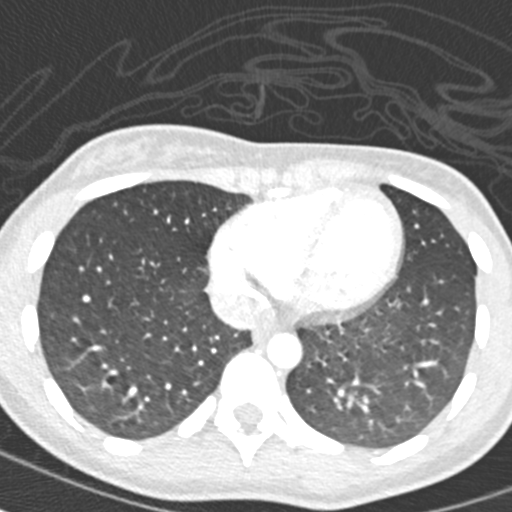
[im 90/254  soft-tissue]
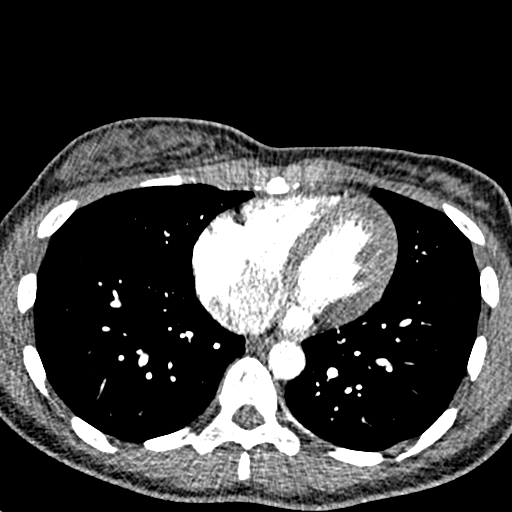
[im 105/254  lung]
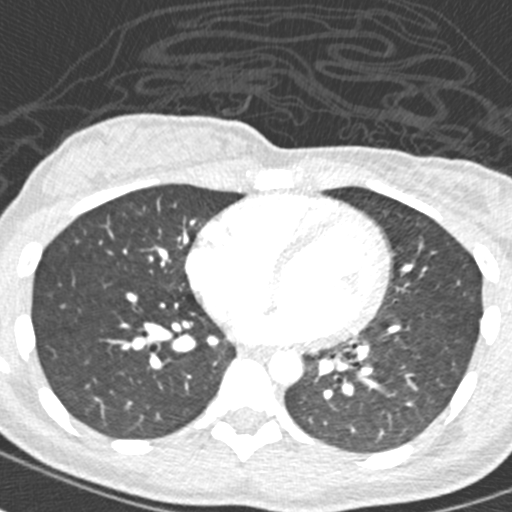
[im 120/254  soft-tissue]
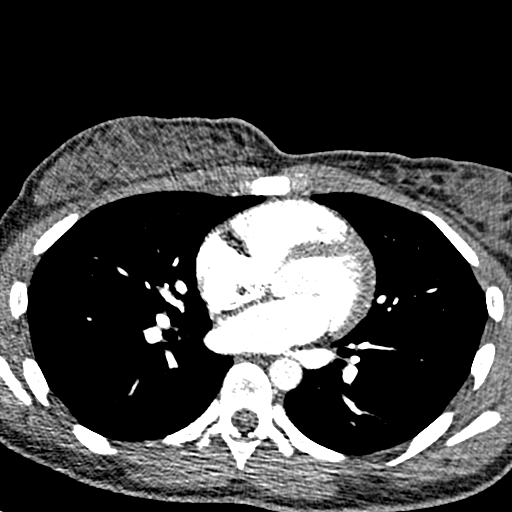
[im 134/254  lung]
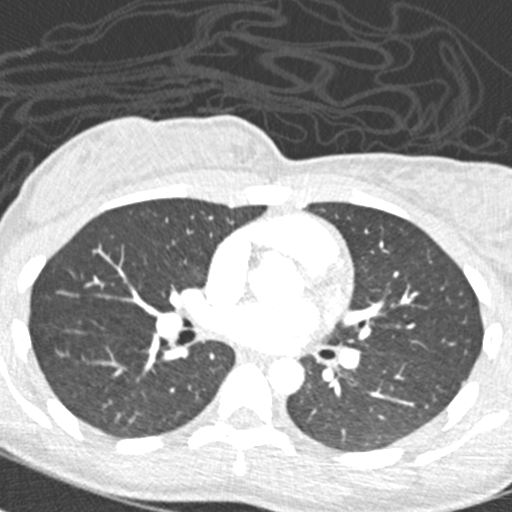
[im 149/254  soft-tissue]
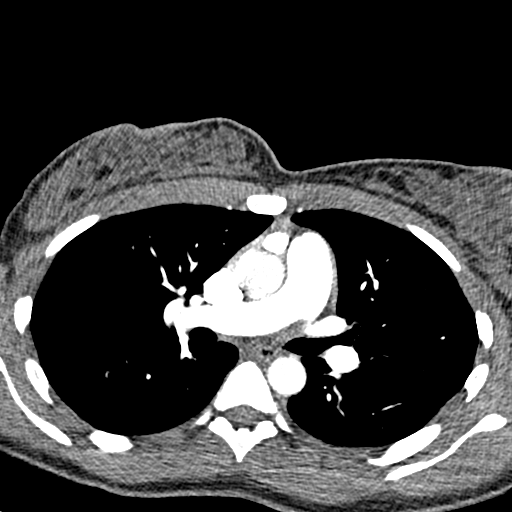
[im 164/254  lung]
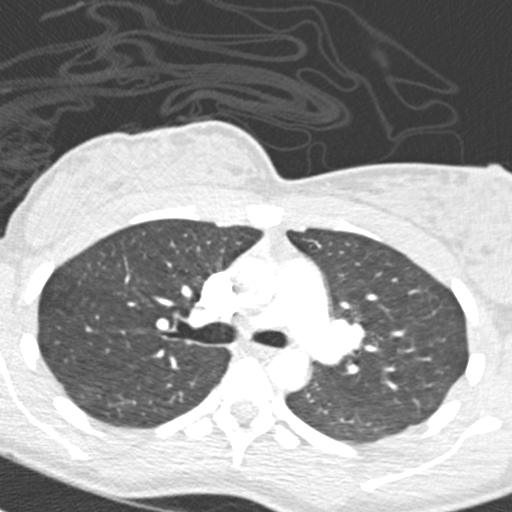
[im 179/254  soft-tissue]
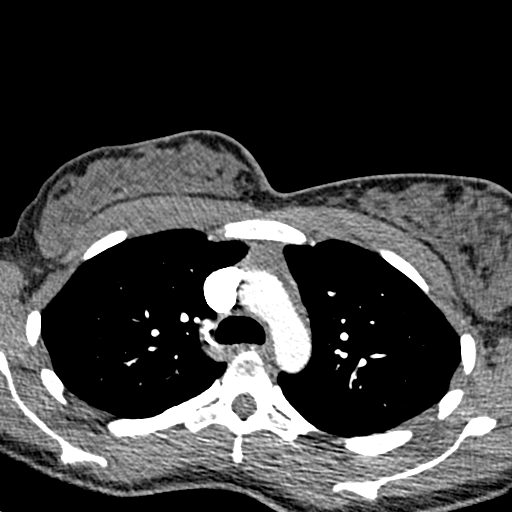
[im 194/254  lung]
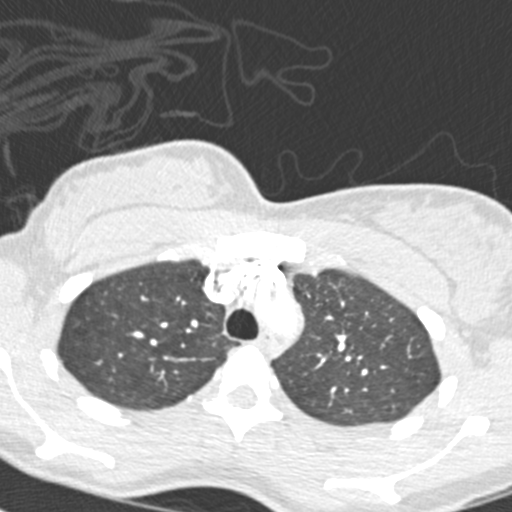
[im 209/254  soft-tissue]
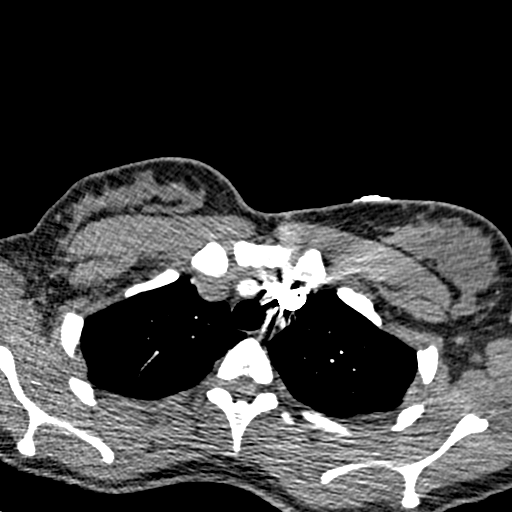
[im 224/254  lung]
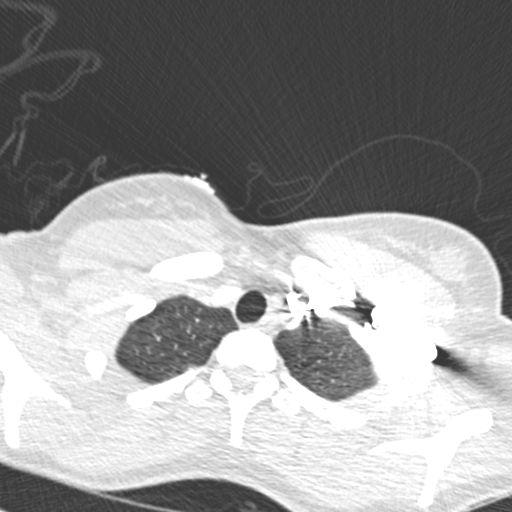
[im 239/254  soft-tissue]
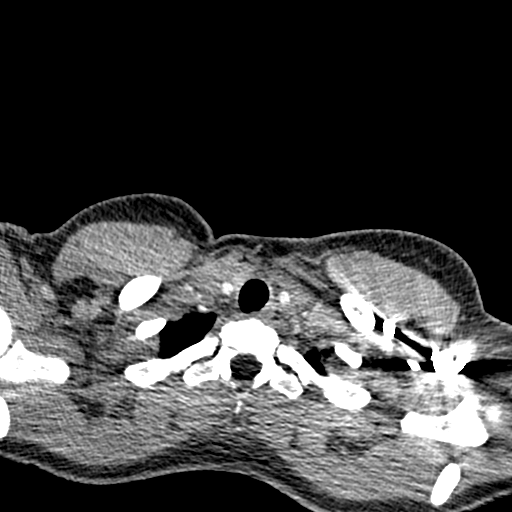

[Series 7: lung · axial · 0.59mm/px · z∈[+928,+976]mm · 2 of 79 slices shown]
[im 16/79  soft-tissue]
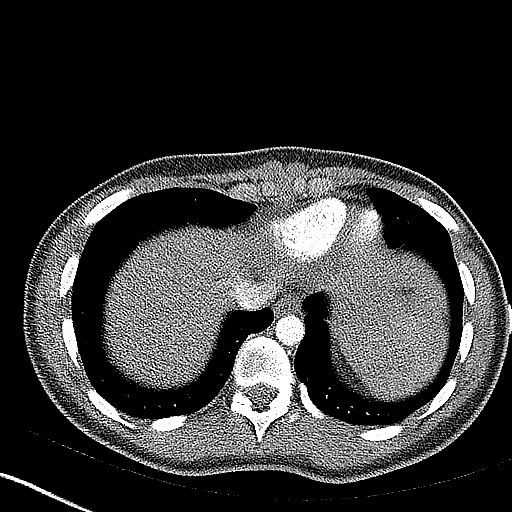
[im 32/79  soft-tissue]
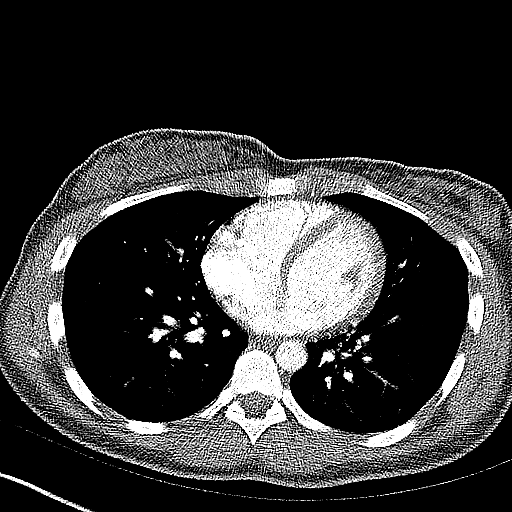

[Series 8: coronal mpr · coronal · 0.53mm/px · 1 of 89 slices shown]
[im 45/89  soft-tissue]
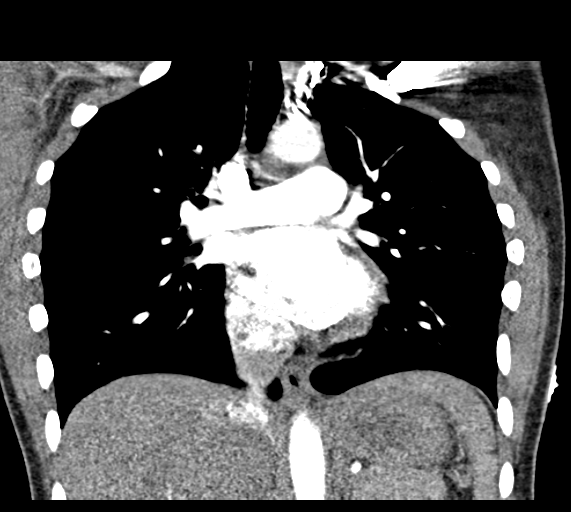

[19 of 46 positions shown; findings below may reference images not displayed]

FINDINGS: THORACIC INLET/BODY WALL:

No acute abnormality.

MEDIASTINUM:

Normal heart size. No pericardial effusion. No acute vascular
abnormality, including pulmonary embolism or aortic dissection. No
adenopathy.

LUNG WINDOWS:

No consolidation. No effusion. Stable 6 mm nodule at the right apex,
likely postinflammatory.

UPPER ABDOMEN:

No acute findings.

OSSEOUS:

No acute fracture.  No suspicious lytic or blastic lesions.

Review of the MIP images confirms the above findings.
IMPRESSION: No evidence of pulmonary embolism or other cause of chest pain.

## 2016-11-15 IMAGING — US US OB COMP LESS 14 WK
1 series · 14 of 23 positions shown · non-contrast
Comparison: None available.

CLINICAL DATA: Vaginal bleeding in first-trimester pregnancy.

EXAM:
OBSTETRIC <14 WK ULTRASOUND
TECHNIQUE: Transabdominal ultrasound was performed for evaluation of the
gestation as well as the maternal uterus and adnexal regions.

[Series 1: us ob comp less 14 wks · 23 acquisitions, 14 frames shown]
[im 1/23]
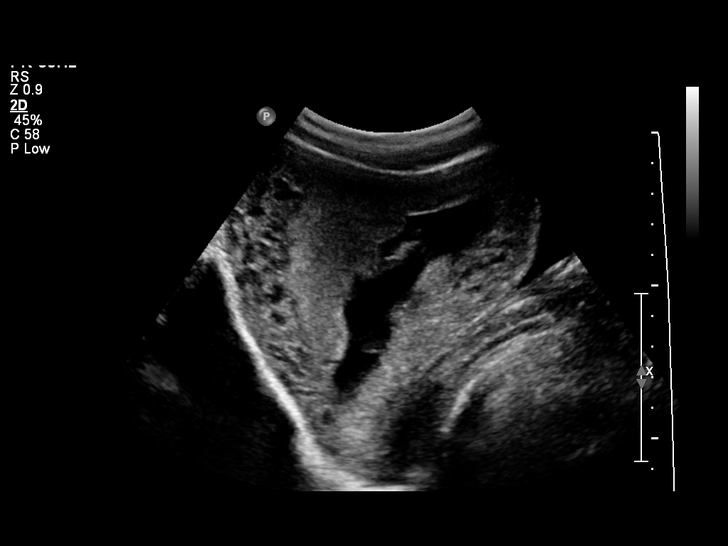
[im 3/23]
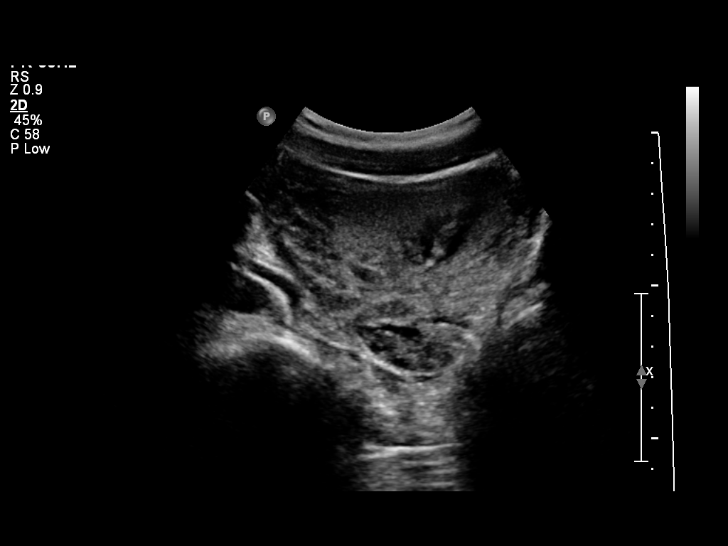
[im 5/23]
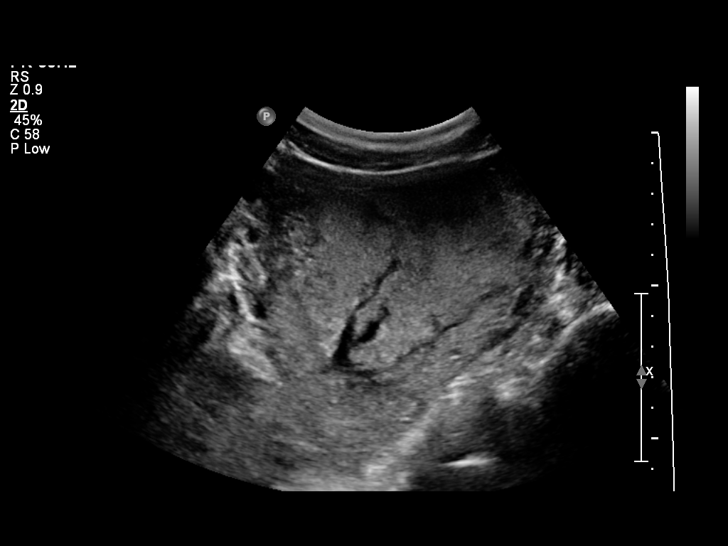
[im 6/23]
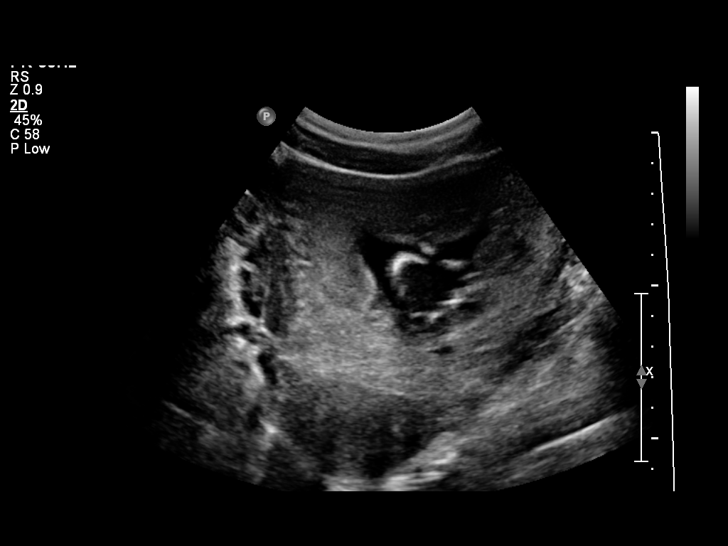
[im 8/23]
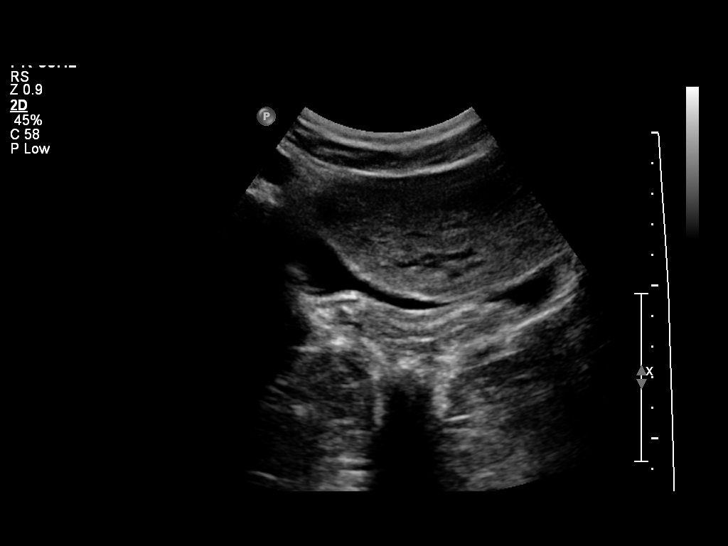
[im 10/23]
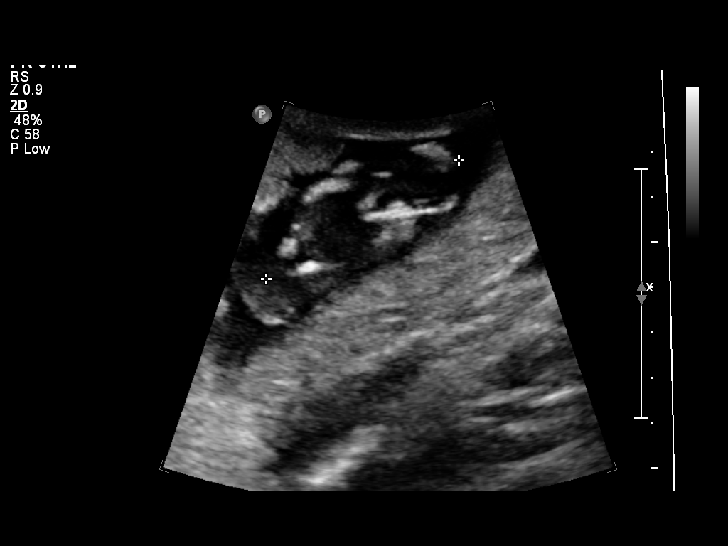
[im 11/23]
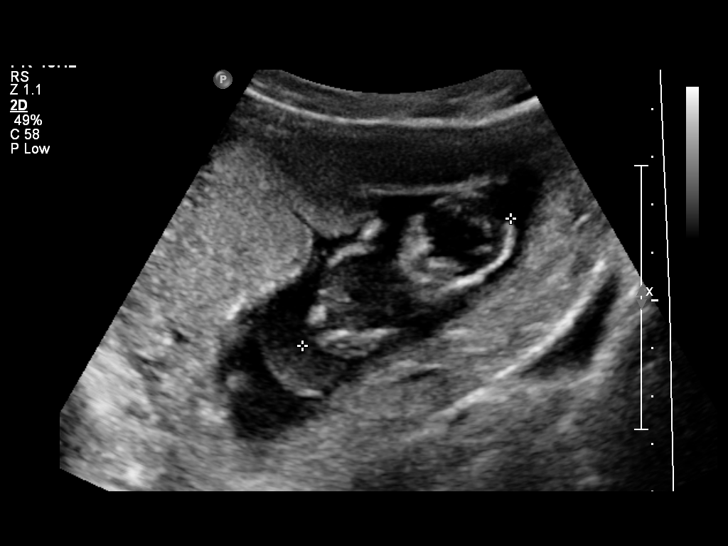
[im 13/23]
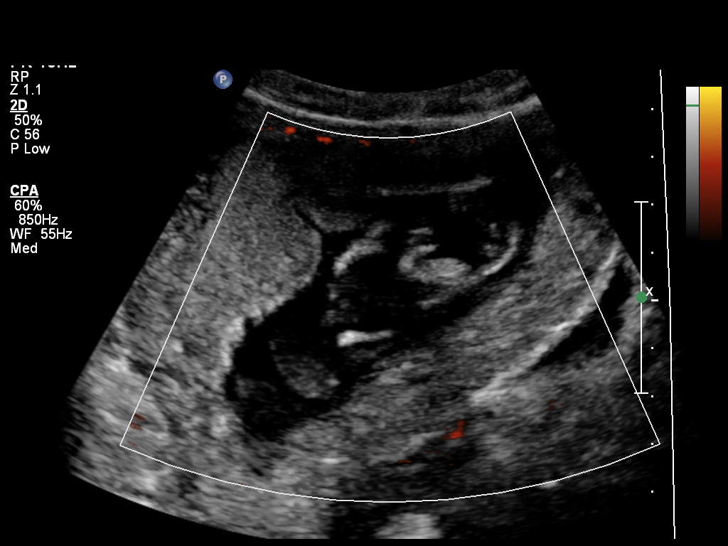
[im 14/23]
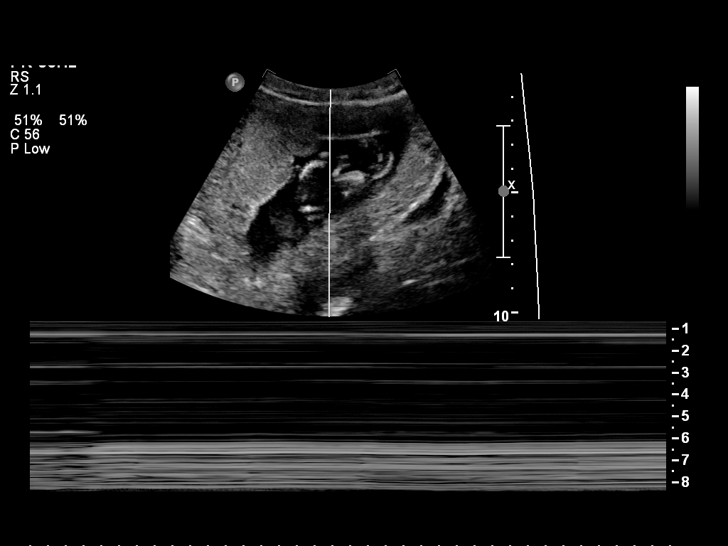
[im 16/23]
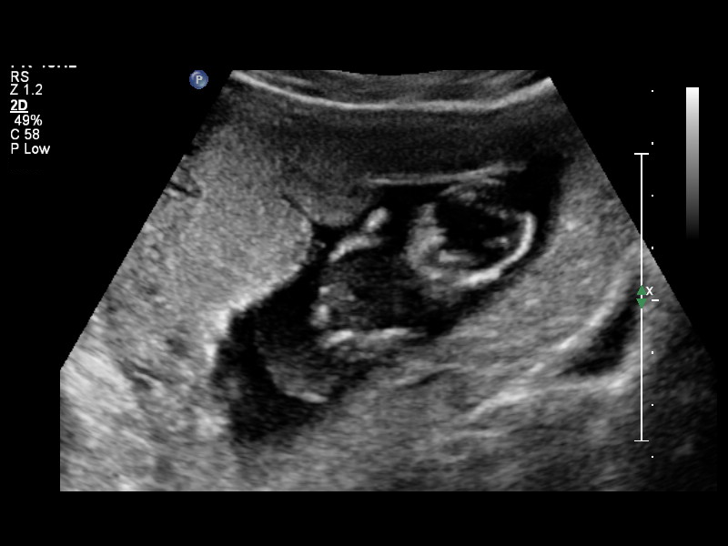
[im 18/23]
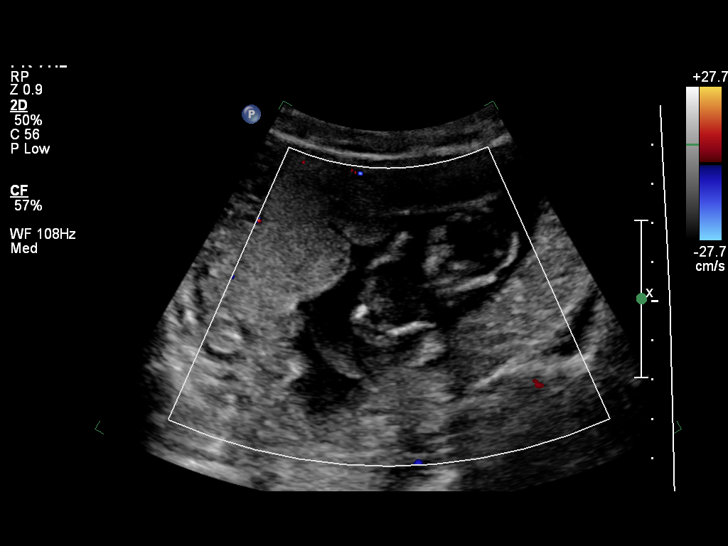
[im 19/23]
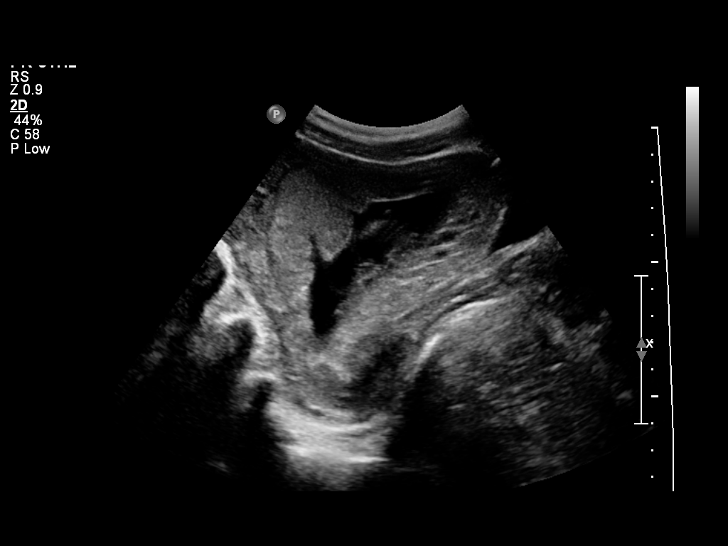
[im 21/23]
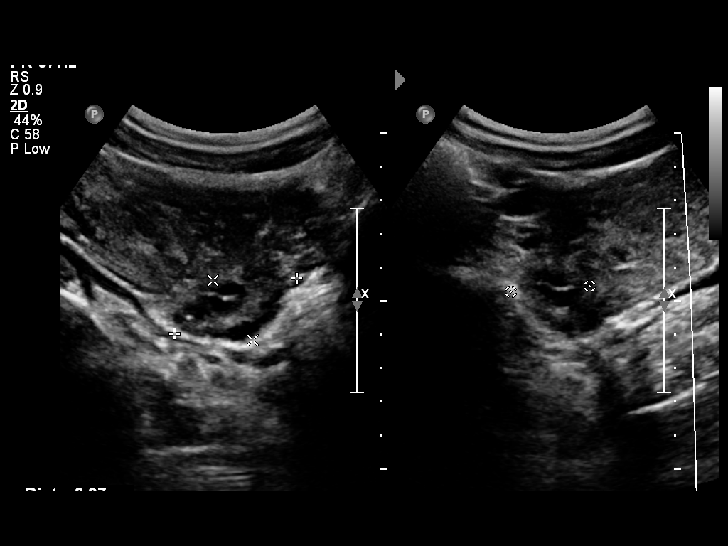
[im 23/23]
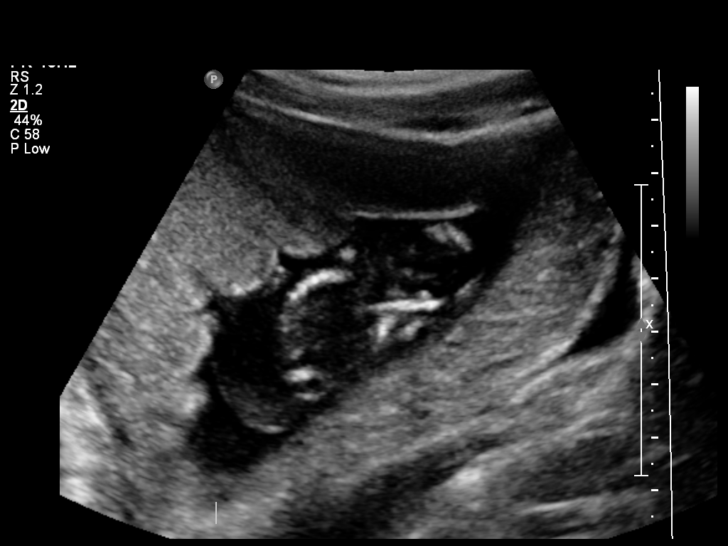

[14 of 23 positions shown; findings below may reference images not displayed]

Per report, scan at [facility]
right 9 weeks with a viable intrauterine gestation.
FINDINGS: Intrauterine gestational sac:  Visualized, irregular in shape.

Yolk sac:  Not present.

Embryo:  Present

Cardiac Activity: Not present

CRL:   50.7  mm   11 w 6 d

Maternal uterus/adnexae: Both ovaries are identified and normal. No
pelvic free fluid.
IMPRESSION: Intrauterine pregnancy with irregular gestational sac and no fetal
heart tones or cardiac activity. Findings are consistent with fetal
demise/nonviable pregnancy.
